# Patient Record
Sex: Male | Born: 1945 | Race: Black or African American | Hispanic: No | Marital: Married | Smoking: Current every day smoker
Health system: Southern US, Community
[De-identification: ages and names within clinical notes are randomized; demographics above are authoritative.]

## PROBLEM LIST (undated history)

## (undated) DIAGNOSIS — R011 Cardiac murmur, unspecified: Secondary | ICD-10-CM

## (undated) DIAGNOSIS — G709 Myoneural disorder, unspecified: Secondary | ICD-10-CM

## (undated) DIAGNOSIS — M1990 Unspecified osteoarthritis, unspecified site: Secondary | ICD-10-CM

## (undated) DIAGNOSIS — Z9289 Personal history of other medical treatment: Secondary | ICD-10-CM

## (undated) DIAGNOSIS — I10 Essential (primary) hypertension: Secondary | ICD-10-CM

## (undated) DIAGNOSIS — E7800 Pure hypercholesterolemia, unspecified: Secondary | ICD-10-CM

## (undated) DIAGNOSIS — F419 Anxiety disorder, unspecified: Secondary | ICD-10-CM

## (undated) HISTORY — PX: BACK SURGERY: SHX140

## (undated) HISTORY — PX: EYE SURGERY: SHX253

## (undated) HISTORY — PX: HERNIA REPAIR: SHX51

## (undated) HISTORY — PX: KNEE ARTHROSCOPY: SUR90

---

## 1999-03-18 ENCOUNTER — Encounter: Payer: Self-pay | Admitting: Urology

## 1999-03-18 ENCOUNTER — Ambulatory Visit (HOSPITAL_COMMUNITY): Admission: RE | Admit: 1999-03-18 | Discharge: 1999-03-18 | Payer: Self-pay | Admitting: Urology

## 1999-05-27 ENCOUNTER — Ambulatory Visit (HOSPITAL_BASED_OUTPATIENT_CLINIC_OR_DEPARTMENT_OTHER): Admission: RE | Admit: 1999-05-27 | Discharge: 1999-05-27 | Payer: Self-pay | Admitting: Orthopedic Surgery

## 1999-10-25 ENCOUNTER — Encounter: Payer: Self-pay | Admitting: Emergency Medicine

## 1999-10-25 ENCOUNTER — Ambulatory Visit (HOSPITAL_COMMUNITY): Admission: RE | Admit: 1999-10-25 | Discharge: 1999-10-25 | Payer: Self-pay | Admitting: Emergency Medicine

## 1999-10-28 ENCOUNTER — Observation Stay (HOSPITAL_COMMUNITY): Admission: RE | Admit: 1999-10-28 | Discharge: 1999-10-29 | Payer: Self-pay | Admitting: Neurosurgery

## 2002-09-27 ENCOUNTER — Encounter: Payer: Self-pay | Admitting: Infectious Diseases

## 2002-09-27 ENCOUNTER — Ambulatory Visit (HOSPITAL_COMMUNITY): Admission: RE | Admit: 2002-09-27 | Discharge: 2002-09-27 | Payer: Self-pay

## 2003-03-06 ENCOUNTER — Observation Stay (HOSPITAL_COMMUNITY): Admission: RE | Admit: 2003-03-06 | Discharge: 2003-03-07 | Payer: Self-pay | Admitting: Neurosurgery

## 2003-11-17 ENCOUNTER — Ambulatory Visit (HOSPITAL_COMMUNITY): Admission: RE | Admit: 2003-11-17 | Discharge: 2003-11-17 | Payer: Self-pay | Admitting: Urology

## 2006-06-03 ENCOUNTER — Ambulatory Visit (HOSPITAL_COMMUNITY): Admission: RE | Admit: 2006-06-03 | Discharge: 2006-06-03 | Payer: Self-pay | Admitting: Orthopaedic Surgery

## 2007-07-21 ENCOUNTER — Encounter: Admission: RE | Admit: 2007-07-21 | Discharge: 2007-07-21 | Payer: Self-pay | Admitting: Neurosurgery

## 2011-02-04 NOTE — Discharge Summary (Signed)
[place]. [facility]  Patient:    William Meyer, William Meyer                         MRN: [id number] Adm. Date:  [id number] Disc. Date: [id number] Attending:  Tressie Stalker D                           Discharge Summary  HISTORY OF PRESENT ILLNESS:  For full details of this admission, please refer to typed history and physical.  The patient is a 65 year old black male who began having severe left leg pain approximately one month ago.  He was treated with medications.  Unfortunately, his did not improve.  He was worked up with a lumbar MRI and it demonstrated a large extraforaminal, i.e. far lateral disc herniation at L4/5 on the left.  The patients signs, symptoms, and physical exam were consistent with a left L4 radiculopathy.  He therefore weighed the risks, benefits, and alternatives to surgery and decided to proceed with a microdiskectomy.  The past medical history, past surgical history, medications prior to admission, drug allergies, family medical history, social history, admission physical exam, imaging studies, assessment, and plan, etc., please refer to typed and history nd physical.  HOSPITAL COURSE:  I admitted the patient to [facility] on October 28, 1999 with the diagnosis of a far lateral hernia disc L4/5 on the left and L4 radiculopathy.  On the day of admission, I performed a far lateral L4/5 microdiskectomy using microdissection surgery and went well without complications.  For full details f this operation, please refer to typed operative note.  The patients postoperative course is essentially unremarkable.  He remained afebrile.  His vital signs are stable.  By postoperative day #1, he was eating ell and ambulating well.  His wound was healing well without signs of infection and his leg pain was gone.  He was requesting discharge home.  I therefore discharged home on October 29, 1999.  DISCHARGE INSTRUCTIONS:  The  patient was given written discharge instructions. He was instructed to follow up with me in three weeks.  DISCHARGE MEDICATIONS: 1. Percocet 5 mg #50 1-2 p.o. q.4h. p.r.n. pain. 2. Valium 5 mg #50 1-2 p.o. q.6h. p.r.n. muscle spasms.  FINAL DIAGNOSES: 1. Far lateral lumbar vertebrae-4/5 herniated nucleus pulposus on the left. 2. Left lumbar vertebrae-4 radiculopathy. 3. Degenerative disc disease. 4. Lumbar spondylosis.  PROCEDURE:  Left far lateral L4/5 microdiskectomy using microdissection. DD:  10/29/99 TD:  10/30/99 Job: [id number]

## 2011-02-04 NOTE — H&P (Signed)
[place]. [facility]  Patient:    William Meyer, William Meyer                         MRN: [id number] Adm. Date:  [id number] Attending:  Tressie Stalker D                         History and Physical  CHIEF COMPLAINT:  Left leg pain.  HISTORY OF PRESENT ILLNESS:  The patient is a 65 year old black male who had some intermittent trouble with his back in the past.  He has been treated medically nd with steroid injections, with good results in 1997, and 1998.  The patient made a good recovery until about three weeks ago when he was at work.  He bent over to  pick up some material and felt a "catch" in his back, and had immediate back pain with pain radiating down his left leg.  He says it is different than his prior ack pain.  This was reported at work.  He initially saw Dr. Perrin Maltese and Dr. Lesle Chris. The patient was treated with medications and did improve.  They kindly sent him for my consultation.  The patient complains of back pain which has improved significantly, but his left leg is hurting more.  The pain worsens with any activities, and it limits his activities of daily living.  It seems to be worse in the morning.  He describes the pain as radiating down the lateral aspect of his left leg, and at times numbness and paresthesias in his left big toe.  He has been at work for approximately four weeks.  His leg hurts more than his back.  He has no trouble with the right leg.  I sent the patient for a lumbar MRI, which demonstrated a large extraforaminal isk herniation at L4-5 on the left.  I therefore discussed surgery with him, after e failed medical management.  He decided to proceed with the operation.  PAST MEDICAL HISTORY:  Positive for cervical degenerative disk disease, left knee osteoarthritis, carpal tunnel syndrome, ulnar neuropathy.  PAST SURGICAL HISTORY:  A left knee surgery in April 1998.  A three-level anterior cervical diskectomy in  1990.  A right carpal tunnel and a right ulnar nerve release.  CURRENT MEDICATIONS PRIOR TO ADMISSION:  Neurontin 300 mg t.i.d., hydrocodone p.r.n., Vioxx 50 mg p.o. q.d.  ALLERGIES:  No known drug allergies.  FAMILY HISTORY:  The patients mother died at age 31, secondary to stomach cancer. The patients father died at age 57, of unknown causes.  SOCIAL HISTORY:  The patient is married and has three children.  He lives in [place].  He is employed at [employer].  He smokes one pack per day of cigarettes x approximately 30 years.  I advised him to quit.  He denies ethanol or drug use.  REVIEW OF SYSTEMS:  Negative except as above.  PHYSICAL EXAMINATION:  GENERAL:  A pleasant, healthy-appearing 65 year old black male, who walks with n antalgic gait, complaining of left leg pain.  Height 5 feet 9 inches, weight 171 pounds.  HEENT:  Normocephalic, atraumatic.  Pupils equal, round, reactive to light. Extraocular muscles intact.  Sclerae white.  Conjunctivae pink.  Oropharynx benign. Uvula midline.  NECK:  Supple, no masses, meningismus, deformities, tracheal deviation, jugular  venous distention, or carotid bruits.  He has a normal cervical range of motion. Spurlings test is negative.  Lhermittes  sign was not present.  THORAX:  Symmetric.  LUNGS:  Clear to auscultation.  HEART:  A regular rate and rhythm.  ABDOMEN:  Soft, nontender.  No guarding or rebound.  EXTREMITIES:  No obvious deformities.  He has a well-healed surgical scar without signs of infection.  BACK:  No point tenderness or deformities.  He has some tenderness to palpation in the left buttocks region.  Straight leg raising is positive on the left and negative on the right.  Fabere testing is negative bilaterally.  NEUROLOGIC:  The patient is alert and oriented x 3.  Cranial nerves II-XII are grossly intact bilaterally.  Vision and hearing grossly normal bilaterally. Motor strength is  5/5 in the bilateral deltoid, biceps, triceps, hand grip, wrist extensor, and interosseous psoas, quadriceps, gastrocnemius, and extensor hallucis longus.  Cerebellar examination is intact to rapid alternating movements of the  upper extremities bilaterally.  Deep tendon reflexes are 1/4 in the bilateral biceps, triceps, brachial radialis, gastrocnemius, and right quadriceps, trace n the left quadriceps.  He has bilateral flexor plantar reflexes.  No ankle clonus. Sensory examination:  Demonstrates decreased light touch and pinprick sensation in the left L4 or L5 distribution, otherwise normal.  Imaging studies:  The patient had a lumbar MRI performed at [facility] on October 25, 1999, which demonstrates he has some mild degenerative isk disease at L3-4, but no neural compression.  At L4-5 he has a large left-sided extraforaminal i.e. far lateral disk herniation with pressure on the left L4 nerve root.  L5-S1 has some central bulging in the disk, but no significant neural compression.  ASSESSMENT:  L4-5 far lateral herniated nucleus pulposus, degenerative disk disease, spinal stenosis, and spondylosis.  I have discussed the diagnoses with the patient and with his wife, and have reviewed the MRI scan, and pointed out the abnormalities.  His signs and symptoms and physical examination are consistent with a left L4 radiculopathy caused by his extraforaminal disk herniation at L4-5 on the left.  I have discussed the various treatment options with him, including continued medical management and surgery. I have described the procedure of a far lateral microdiskectomy.  I have shown him surgical models and have discussed the risks of surgery.  The patient weighed the risks and benefits and alternatives to surgery, and has decided to proceed with the operation. DD:  10/28/99 TD:  10/28/99 Job: [id number]

## 2011-02-04 NOTE — Op Note (Signed)
William Meyer, William Meyer                            ACCOUNT NO.:  [id number]   MEDICAL RECORD NO.:  [id number]                   PATIENT TYPE:  INP   LOCATION:  3012                                 FACILITY:  [facility]   PHYSICIAN:  Cristi Loron, M.D.            DATE OF BIRTH:  [birth year]   DATE OF PROCEDURE:  03/06/2003  DATE OF DISCHARGE:  03/07/2003                                 OPERATIVE REPORT   BRIEF HISTORY:  The patient is a 65 year old black male whom I performed a  left L4-L5 far lateral microdiskectomy a couple of years ago. He did well  but unfortunately a couple of months ago developed pain in his right leg. He  failed medical management and was worked up with a lumbar MRI that  demonstrated a far lateral herniated disk at L3-L4 on the right. The signs  and symptoms of the exam were consistent with a right L3 radiculopathy. I  discussed the various treatment options with him and his wife. They weighted  the risks, benefits, and alternatives of surgery and decided to proceed with  a microdiskectomy.   PREOPERATIVE DIAGNOSES:  Far lateral right L3-L4 herniated nucleus pulposus,  spinal stenosis, lumbar radiculopathy, lumbago.   POSTOPERATIVE DIAGNOSES:  Far lateral right L3-L4 herniated nucleus  pulposus, spinal stenosis, lumbar radiculopathy, lumbago.   PROCEDURE:  Far lateral right L3-L4 microdiskectomy using microdissection.   SURGEON:  Cristi Loron, M.D.   ASSISTANT:  Stefani Dama, M.D.   ANESTHESIA:  General endotracheal.   ESTIMATED BLOOD LOSS:  Less than 100 mL.   SPECIMENS:  None.   DRAINS:  None.   COMPLICATIONS:  None.   DESCRIPTION OF PROCEDURE:  The patient was brought to the operating room by  the anesthesia team. General endotracheal anesthesia was induced. The  patient was then turned to the prone position on the Wilson frame. His  lumbosacral region was then prepared with Betadine and scrubbed with  Betadine solution. Sterile drapes  were applied. I then injected that area to  be incised with Marcaine with epinephrine solution. I used the scalpel to  make a linear midline incision over the L3-4 interspace. I used  electrocautery to dissect down to the thoracolumbar fascia dividing the  fascia just to the right of midline performing a right subperiosteal  dissection. I stripped the paraspinous musculature from the right spinous  process alignment of L3 and L4. I obtained intraoperative radiograph to  confirm location and then used the electrocautery to detach the fascia from  the lateral aspect of the right L3-L4 facet, inserted the McCall retractor  for exposure. We then used the high speed drill to drill out the lateral  aspect of the right L3-L4 facet joint. We then used the Kerrison punch to  remove a little bit more of the lateral aspect of the facet and exposed the  intertransverse ligament. We brought the operating  microscope onto the field  and under magnification completed the microdissection/decompression. We  dissected through the intertransverse ligament and muscle and identified the  exiting L3 nerve root and then just caudal to this was a large herniated  disk which was compressing the exiting L3 nerve root from the caudal and  ventral direction. We used microdissection to carry the soft tissue over the  disk herniation and then incised the herniation with a 15 blade scalpel and  performed a partial diskectomy using the pituitary forceps and the curettes.  We got several large fragments and this greatly decompressed the L3 nerve  root. We then used the osteophyte probe to remove some redundant ligament  from the interspace. At this point, we had good decompression of the nerve  root. We confirmed this by palpating along the ventral surface of the nerve  root all the way from the anterior spinal portion all the way out and  through soft tissue. We used electrocautery to provide stringent hemostasis.  We then  copiously irrigated the wound out with bacitracin solution and  removed the solution and then removed the McCall retractor and then  reapproximated the patient's thoracolumbar fascia with interrupted lumbar  Vicryl suture, subcutaneous tissue with interrupted 2-0 Vicryl suture and  the skin with Steri-Strips and Benzoin. The wound was then coated with  Bacitracin ointment, sterile dressing was applied, drapes were removed. The  patient was subsequently returned to the supine position and was extubated  by the anesthesia team and transported to the post anesthesia care unit in  stable condition. All sponge, needle and instrument counts were correct at  the end of this case.                                               Cristi Loron, M.D.    JDJ/MEDQ  D:  03/06/2003  T:  03/10/2003  Job:  [id number]

## 2011-02-04 NOTE — Op Note (Signed)
. [facility]  Patient:    William Meyer, William Meyer                         MRN: [id number] Proc. Date: 10/28/99 Adm. Date:  [id number] Disc. Date: [id number] Attending:  Tressie Stalker D                           Operative Report  PREOPERATIVE DIAGNOSIS:  L4-5 degenerative disk disease, herniated nucleus pulposus, and spondylosis.  POSTOPERATIVE DIAGNOSIS:  L4-5 degenerative disk disease, herniated nucleus pulposus, and spondylosis.  PROCEDURE:  Left far lateral (i.e. extraforaminal) microdiskectomy using microdissection of L4-5.  SURGEON:  Cristi Loron, M.D.  ASSISTANT:  Stefani Dama, M.D.  ANESTHESIA:  General endotracheal.  ESTIMATED BLOOD LOSS:  Less than 100 cc.  SPECIMENS:  None.  DRAINS:  None.  COMPLICATIONS:  None.  BRIEF HISTORY:  The patient is a 65 year old black male who has had problems with lumbago in the past, and has been treated with medications and steroid injections. Unfortunately, about a month ago, he bent over at work and hurt his back.  He began having severe left leg pain.  He had failed medical management and therefore was worked up with a lumbar MRI that demonstrated extraforaminal disk herniation at  L4-5 on the left.  His signs and symptoms on physical exam were consistent with a left L4 radiculopathy.  The patient therefore weighed the risks, benefits and alternatives of surgery, and it was decided to proceed with a far lateral L4-5 microdiskectomy.  DESCRIPTION OF PROCEDURE:  The patient was brought to the operating room by the  anesthesia team.  General endotracheal anesthesia was induced.  The patient was  then turned to the prone position on the Wilson frame.  The lumbosacral region as then shaved and prepared with Betadine scrub with Betadine solution, and sterile drapes were applied.  I then injected the area to be incised with Marcaine with  epinephrine solution.  I then used scalpel to make  a midline incision over the 4-5 interspace.  I used electrocautery to dissect down to the thoracolumbar fascia, and divided it just to the left of the midline, and performed a left-sided subperiosteal dissection, stripping the paraspinous musculature from the left spinous process and lamina of L4 and L5.  I then obtained an intraoperative radiograph that confirmed that I was at L4-5.  I then used electrocautery to attach the fascia from the lateral aspect of the L4-5 facet joint.  I then inserted the [facility] retractor for exposure.  I then brought the operative microscope into the field and under instant magnification and illumination, I completed the microdissection/decompression. I used a Midas Rex high speed drill to drill away the lateral aspect of the left 4-5 facet joint.  I drilled down until I encountered the anterior transverse ligament. I then divided this ligament with the 15 blade scalpel and the Kerrison punches.  I then carefully dissected down into the soft tissues using microdissection.  I identified the right L4 nerve, just ventral and inferior to it with a large herniated disk, i.e. a far lateral herniated disk from L4-5.  I incised this herniated disk, removed it in several fragments.  The disk had migrated in a cephalad direction, significantly compressing the left L4 nerve root.  I then encountered a rather large hole in the annulus.  I then widened this hole with he 15 blade  scalpel.  I performed a partial anterior vertebral diskectomy at L4-5 using the pituitary forceps, Epstein and Scoville curets.  After I was satisfied with the diskectomy, I then palpated the ventral surface f the L4 nerve root with the coronary dilator, and noted it was well-decompressed all the way throughout the neuroforamen.  I then achieved strenuous hemostasis using bipolar electrocautery and then copiously irrigated the wound out with Bacitracin solution.  I then deposited  Depo-Medrol and fentanyl solution into the epidural  space, and removed the McCullough retractor, and then reapproximated the patients thoracolumbar fascia with interrupted #1 Vicryl, the subcutaneous tissue with interrupted 2-0 Vicryl, the skin with Steri-Strips and Benzoin.  The wound was hen coated with Bacitracin ointment and a sterile dressing applied.  The drapes were removed and the patient was subsequently extubated by the anesthesia team, and transported to the postanesthesia care unit in stable condition.  All sponge, instrument, and needle counts were correct at the end of the case. DD:  10/28/99 TD:  10/29/99 Job: [id number]

## 2014-04-17 ENCOUNTER — Other Ambulatory Visit (HOSPITAL_COMMUNITY): Payer: Self-pay | Admitting: Neurosurgery

## 2014-04-17 DIAGNOSIS — M25569 Pain in unspecified knee: Secondary | ICD-10-CM

## 2014-04-23 ENCOUNTER — Ambulatory Visit (HOSPITAL_COMMUNITY): Payer: Medicare Other | Attending: Neurosurgery

## 2014-07-07 ENCOUNTER — Other Ambulatory Visit (HOSPITAL_COMMUNITY): Payer: Self-pay | Admitting: Neurosurgery

## 2014-07-18 ENCOUNTER — Encounter (HOSPITAL_COMMUNITY): Payer: Self-pay | Admitting: Pharmacy Technician

## 2014-07-23 NOTE — Pre-Procedure Instructions (Signed)
August SaucerHoby R Iannelli  07/23/2014   Your procedure is scheduled on: Wednesday, July 30, 2014  [facility] 6:30 AM.  Call this number if you have problems the morning of surgery: [phone]   Remember:   Do not eat food or drink liquids after midnight Tuesday, July 29, 2014   Take these medicines the morning of surgery with A SIP OF WATER: cetirizine (ZYRTEC), if needed:   HYDROcodone  (NORCO/VICODIN) for pain Stop taking Aspirin, vitamins, and herbal medications  (Omega-3 Fatty Acids (FISH OIL).  Do not take any NSAIDs ie: Ibuprofen, Advil, Naproxen or any medication containing Aspirin; stop 5 days prior to procedure ( Friday. July 25, 2014 ).  Do not wear jewelry, make-up or nail polish.  Do not wear lotions, powders, or perfumes. You may not wear deodorant.  Do not shave 48 hours prior to surgery. Men may shave face and neck.  Do not bring valuables to the hospital.  [facility] is not responsible for any belongings or valuables.               Contacts, dentures or bridgework may not be worn into surgery.  Leave suitcase in the car. After surgery it may be brought to your room.  For patients admitted to the hospital, discharge time is determined by your treatment team.               Patients discharged the day of surgery will not be allowed to drive home.  Name and phone number of your driver:  Special Instructions:  Special Instructions:Special Instructions: [facility] - Preparing for Surgery  Before surgery, you can play an important role.  Because skin is not sterile, your skin needs to be as free of germs as possible.  You can reduce the number of germs on you skin by washing with CHG (chlorahexidine gluconate) soap before surgery.  CHG is an antiseptic cleaner which kills germs and bonds with the skin to continue killing germs even after washing.  Please DO NOT use if you have an allergy to CHG or antibacterial soaps.  If your skin becomes  reddened/irritated stop using the CHG and inform your nurse when you arrive at Short Stay.  Do not shave (including legs and underarms) for at least 48 hours prior to the first CHG shower.  You may shave your face.  Please follow these instructions carefully:   1.  Shower with CHG Soap the night before surgery and the morning of Surgery.  2.  If you choose to wash your hair, wash your hair first as usual with your normal shampoo.  3.  After you shampoo, rinse your hair and body thoroughly to remove the Shampoo.  4.  Use CHG as you would any other liquid soap.  You can apply chg directly  to the skin and wash gently with scrungie or a clean washcloth.  5.  Apply the CHG Soap to your body ONLY FROM THE NECK DOWN.  Do not use on open wounds or open sores.  Avoid contact with your eyes, ears, mouth and genitals (private parts).  Wash genitals (private parts) with your normal soap.  6.  Wash thoroughly, paying special attention to the area where your surgery will be performed.  7.  Thoroughly rinse your body with warm water from the neck down.  8.  DO NOT shower/wash with your normal soap after using and rinsing off the CHG Soap.  9.  Pat yourself dry  with a clean towel.            10.  Wear clean pajamas.            11.  Place clean sheets on your bed the night of your first shower and do not sleep with pets.  Day of Surgery  Do not apply any lotions/deodorants the morning of surgery.  Please wear clean clothes to the hospital/surgery center.   Please read over the following fact sheets that you were given: Pain Booklet, Coughing and Deep Breathing, MRSA Information and Surgical Site Infection Prevention

## 2014-07-24 ENCOUNTER — Encounter (HOSPITAL_COMMUNITY): Payer: Self-pay

## 2014-07-24 ENCOUNTER — Encounter (HOSPITAL_COMMUNITY)
Admission: RE | Admit: 2014-07-24 | Discharge: 2014-07-24 | Disposition: A | Discharge status: Home / Self Care | Payer: Medicare Other | Source: Ambulatory Visit | Attending: Neurosurgery | Admitting: Neurosurgery

## 2014-07-24 DIAGNOSIS — Z01812 Encounter for preprocedural laboratory examination: Secondary | ICD-10-CM | POA: Diagnosis present

## 2014-07-24 DIAGNOSIS — M4806 Spinal stenosis, lumbar region: Secondary | ICD-10-CM | POA: Insufficient documentation

## 2014-07-24 HISTORY — DX: Myoneural disorder, unspecified: G70.9

## 2014-07-24 HISTORY — DX: Anxiety disorder, unspecified: F41.9

## 2014-07-24 HISTORY — DX: Unspecified osteoarthritis, unspecified site: M19.90

## 2014-07-24 HISTORY — DX: Personal history of other medical treatment: Z92.89

## 2014-07-24 HISTORY — DX: Cardiac murmur, unspecified: R01.1

## 2014-07-24 LAB — BASIC METABOLIC PANEL
ANION GAP: 10 (ref 5–15)
BUN: 13 mg/dL (ref 6–23)
CALCIUM: 9.5 mg/dL (ref 8.4–10.5)
CO2: 27 meq/L (ref 19–32)
CREATININE: 0.91 mg/dL (ref 0.50–1.35)
Chloride: 103 mEq/L (ref 96–112)
GFR calc Af Amer: >90 mL/min (ref >90)
GFR, EST NON AFRICAN AMERICAN: 86 mL/min — AB (ref >90)
GLUCOSE: 96 mg/dL (ref 70–99)
Potassium: 4.8 mEq/L (ref 3.7–5.3)
Sodium: 140 mEq/L (ref 137–147)

## 2014-07-24 LAB — CBC
HCT: 42.2 % (ref 39.0–52.0)
Hemoglobin: 13.8 g/dL (ref 13.0–17.0)
MCH: 22.9 pg — ABNORMAL LOW (ref 26.0–34.0)
MCHC: 32.7 g/dL (ref 30.0–36.0)
MCV: 70.1 fL — ABNORMAL LOW (ref 78.0–100.0)
Platelets: 239 10*3/uL (ref 150–400)
RBC: 6.02 MIL/uL — AB (ref 4.22–5.81)
RDW: 15 % (ref 11.5–15.5)
WBC: 8.3 10*3/uL (ref 4.0–10.5)

## 2014-07-24 LAB — SURGICAL PCR SCREEN
MRSA, PCR: NEGATIVE
STAPHYLOCOCCUS AUREUS: NEGATIVE

## 2014-07-24 NOTE — Progress Notes (Signed)
Pt. [facility]- Dr. Gerlene FeePiva.  Pt. Never seen by cardiology, states several yrs. Ago he was told that he had a heart murmur.  Last EKG, approx. 2 yrs. Ago, at [facility], for cataract surgery

## 2014-07-29 MED ORDER — CEFAZOLIN SODIUM-DEXTROSE 2-3 GM-% IV SOLR
2.0000 g | INTRAVENOUS | Status: AC
  Administered 2014-07-30: 2 g via INTRAVENOUS
  Filled 2014-07-29: qty 50

## 2014-07-30 ENCOUNTER — Inpatient Hospital Stay (HOSPITAL_COMMUNITY): Payer: No Typology Code available for payment source | Admitting: Anesthesiology

## 2014-07-30 ENCOUNTER — Encounter (HOSPITAL_COMMUNITY)
Admission: RE | Disposition: A | Discharge status: Home / Self Care | Payer: Self-pay | Source: Ambulatory Visit | Attending: Neurosurgery

## 2014-07-30 ENCOUNTER — Inpatient Hospital Stay (HOSPITAL_COMMUNITY)
Admission: RE | Admit: 2014-07-30 | Discharge: 2014-07-31 | DRG: 515 | Disposition: A | Discharge status: Home / Self Care | Payer: No Typology Code available for payment source | Source: Ambulatory Visit | Attending: Neurosurgery | Admitting: Neurosurgery

## 2014-07-30 ENCOUNTER — Encounter (HOSPITAL_COMMUNITY): Payer: Self-pay

## 2014-07-30 ENCOUNTER — Inpatient Hospital Stay (HOSPITAL_COMMUNITY): Payer: No Typology Code available for payment source

## 2014-07-30 DIAGNOSIS — M48062 Spinal stenosis, lumbar region with neurogenic claudication: Secondary | ICD-10-CM | POA: Diagnosis present

## 2014-07-30 DIAGNOSIS — M549 Dorsalgia, unspecified: Secondary | ICD-10-CM | POA: Diagnosis present

## 2014-07-30 DIAGNOSIS — M4806 Spinal stenosis, lumbar region: Secondary | ICD-10-CM | POA: Diagnosis present

## 2014-07-30 DIAGNOSIS — F419 Anxiety disorder, unspecified: Secondary | ICD-10-CM | POA: Diagnosis present

## 2014-07-30 DIAGNOSIS — F17210 Nicotine dependence, cigarettes, uncomplicated: Secondary | ICD-10-CM | POA: Diagnosis present

## 2014-07-30 DIAGNOSIS — Z981 Arthrodesis status: Secondary | ICD-10-CM | POA: Diagnosis not present

## 2014-07-30 DIAGNOSIS — G9519 Other vascular myelopathies: Secondary | ICD-10-CM | POA: Diagnosis present

## 2014-07-30 DIAGNOSIS — M48061 Spinal stenosis, lumbar region without neurogenic claudication: Secondary | ICD-10-CM

## 2014-07-30 HISTORY — PX: LUMBAR LAMINECTOMY/DECOMPRESSION MICRODISCECTOMY: SHX5026

## 2014-07-30 SURGERY — LUMBAR LAMINECTOMY/DECOMPRESSION MICRODISCECTOMY 4 LEVEL
Anesthesia: General | Site: Back

## 2014-07-30 MED ORDER — BUPIVACAINE LIPOSOME 1.3 % IJ SUSP
20.0000 mL | INTRAMUSCULAR | Status: DC
Start: 2014-07-30 — End: 2014-07-30
  Filled 2014-07-30: qty 20

## 2014-07-30 MED ORDER — ROCURONIUM BROMIDE 50 MG/5ML IV SOLN
INTRAVENOUS | Status: AC
  Filled 2014-07-30: qty 1

## 2014-07-30 MED ORDER — LIDOCAINE HCL (CARDIAC) 20 MG/ML IV SOLN
INTRAVENOUS | Status: DC | PRN
  Administered 2014-07-30: 80 mg via INTRAVENOUS

## 2014-07-30 MED ORDER — LIDOCAINE HCL (CARDIAC) 20 MG/ML IV SOLN
INTRAVENOUS | Status: AC
  Filled 2014-07-30: qty 5

## 2014-07-30 MED ORDER — ACETAMINOPHEN 650 MG RE SUPP: 650.0000 mg | RECTAL | Status: DC | PRN

## 2014-07-30 MED ORDER — HYDROMORPHONE HCL 1 MG/ML IJ SOLN
INTRAMUSCULAR | Status: AC
  Filled 2014-07-30: qty 1

## 2014-07-30 MED ORDER — ALUM & MAG HYDROXIDE-SIMETH 200-200-20 MG/5ML PO SUSP: 30.0000 mL | Freq: Four times a day (QID) | ORAL | Status: DC | PRN

## 2014-07-30 MED ORDER — OXYCODONE HCL 5 MG PO TABS: 5.0000 mg | ORAL_TABLET | Freq: Once | ORAL | Status: DC | PRN

## 2014-07-30 MED ORDER — OXYCODONE-ACETAMINOPHEN 5-325 MG PO TABS
1.0000 | ORAL_TABLET | ORAL | Status: DC | PRN
  Administered 2014-07-30 (×2): 2 via ORAL
  Filled 2014-07-30 (×3): qty 2

## 2014-07-30 MED ORDER — SUCCINYLCHOLINE CHLORIDE 20 MG/ML IJ SOLN
INTRAMUSCULAR | Status: AC
  Filled 2014-07-30: qty 1

## 2014-07-30 MED ORDER — THROMBIN 20000 UNITS EX SOLR
CUTANEOUS | Status: DC | PRN
  Administered 2014-07-30: 09:00:00 via TOPICAL

## 2014-07-30 MED ORDER — ONDANSETRON HCL 4 MG/2ML IJ SOLN: 4.0000 mg | INTRAMUSCULAR | Status: DC | PRN

## 2014-07-30 MED ORDER — GLYCOPYRROLATE 0.2 MG/ML IJ SOLN
INTRAMUSCULAR | Status: DC | PRN
  Administered 2014-07-30: 0.4 mg via INTRAVENOUS

## 2014-07-30 MED ORDER — MIDAZOLAM HCL 2 MG/2ML IJ SOLN
INTRAMUSCULAR | Status: AC
  Filled 2014-07-30: qty 2

## 2014-07-30 MED ORDER — PHENYLEPHRINE HCL 10 MG/ML IJ SOLN
INTRAMUSCULAR | Status: DC | PRN
  Administered 2014-07-30 (×3): 80 ug via INTRAVENOUS

## 2014-07-30 MED ORDER — PROPOFOL 10 MG/ML IV BOLUS
INTRAVENOUS | Status: DC | PRN
  Administered 2014-07-30: 180 mg via INTRAVENOUS

## 2014-07-30 MED ORDER — PHENYLEPHRINE 40 MCG/ML (10ML) SYRINGE FOR IV PUSH (FOR BLOOD PRESSURE SUPPORT)
PREFILLED_SYRINGE | INTRAVENOUS | Status: AC
  Filled 2014-07-30: qty 10

## 2014-07-30 MED ORDER — CEFAZOLIN SODIUM-DEXTROSE 2-3 GM-% IV SOLR
2.0000 g | Freq: Three times a day (TID) | INTRAVENOUS | Status: AC
  Administered 2014-07-30 (×2): 2 g via INTRAVENOUS
  Filled 2014-07-30 (×3): qty 50

## 2014-07-30 MED ORDER — SODIUM CHLORIDE 0.9 % IR SOLN
Status: DC | PRN
  Administered 2014-07-30: 09:00:00

## 2014-07-30 MED ORDER — ROCURONIUM BROMIDE 100 MG/10ML IV SOLN
INTRAVENOUS | Status: DC | PRN
  Administered 2014-07-30: 50 mg via INTRAVENOUS
  Administered 2014-07-30: 30 mg via INTRAVENOUS

## 2014-07-30 MED ORDER — MENTHOL 3 MG MT LOZG: 1.0000 | LOZENGE | OROMUCOSAL | Status: DC | PRN

## 2014-07-30 MED ORDER — SODIUM CHLORIDE 0.9 % IV SOLN
10.0000 mg | INTRAVENOUS | Status: DC | PRN
  Administered 2014-07-30: 10 ug/min via INTRAVENOUS

## 2014-07-30 MED ORDER — ACETAMINOPHEN 325 MG PO TABS: 650.0000 mg | ORAL_TABLET | ORAL | Status: DC | PRN

## 2014-07-30 MED ORDER — GLYCOPYRROLATE 0.2 MG/ML IJ SOLN
INTRAMUSCULAR | Status: AC
  Filled 2014-07-30: qty 2

## 2014-07-30 MED ORDER — 0.9 % SODIUM CHLORIDE (POUR BTL) OPTIME
TOPICAL | Status: DC | PRN
  Administered 2014-07-30: 1000 mL

## 2014-07-30 MED ORDER — ONDANSETRON HCL 4 MG/2ML IJ SOLN
INTRAMUSCULAR | Status: AC
  Filled 2014-07-30: qty 2

## 2014-07-30 MED ORDER — MORPHINE SULFATE 2 MG/ML IJ SOLN
1.0000 mg | INTRAMUSCULAR | Status: DC | PRN
  Administered 2014-07-31: 4 mg via INTRAVENOUS
  Filled 2014-07-30: qty 2

## 2014-07-30 MED ORDER — FENTANYL CITRATE 0.05 MG/ML IJ SOLN
INTRAMUSCULAR | Status: DC | PRN
  Administered 2014-07-30: 50 ug via INTRAVENOUS
  Administered 2014-07-30: 100 ug via INTRAVENOUS
  Administered 2014-07-30 (×2): 50 ug via INTRAVENOUS

## 2014-07-30 MED ORDER — ONDANSETRON HCL 4 MG/2ML IJ SOLN
INTRAMUSCULAR | Status: DC | PRN
  Administered 2014-07-30: 4 mg via INTRAVENOUS

## 2014-07-30 MED ORDER — OXYCODONE HCL 5 MG/5ML PO SOLN: 5.0000 mg | Freq: Once | ORAL | Status: DC | PRN

## 2014-07-30 MED ORDER — LACTATED RINGERS IV SOLN: INTRAVENOUS | Status: DC

## 2014-07-30 MED ORDER — NEOSTIGMINE METHYLSULFATE 10 MG/10ML IV SOLN
INTRAVENOUS | Status: DC | PRN
  Administered 2014-07-30: 3 mg via INTRAVENOUS

## 2014-07-30 MED ORDER — BACITRACIN ZINC 500 UNIT/GM EX OINT
TOPICAL_OINTMENT | CUTANEOUS | Status: DC | PRN
  Administered 2014-07-30: 1 via TOPICAL

## 2014-07-30 MED ORDER — HYDROMORPHONE HCL 1 MG/ML IJ SOLN
0.2500 mg | INTRAMUSCULAR | Status: DC | PRN
  Administered 2014-07-30 (×4): 0.5 mg via INTRAVENOUS

## 2014-07-30 MED ORDER — ONDANSETRON HCL 4 MG/2ML IJ SOLN: 4.0000 mg | Freq: Four times a day (QID) | INTRAMUSCULAR | Status: DC | PRN

## 2014-07-30 MED ORDER — HYDROCODONE-ACETAMINOPHEN 5-325 MG PO TABS
1.0000 | ORAL_TABLET | ORAL | Status: DC | PRN
  Administered 2014-07-31 (×3): 2 via ORAL
  Filled 2014-07-30 (×3): qty 2

## 2014-07-30 MED ORDER — PHENYLEPHRINE HCL 10 MG/ML IJ SOLN
INTRAMUSCULAR | Status: AC
  Filled 2014-07-30: qty 1

## 2014-07-30 MED ORDER — DOCUSATE SODIUM 100 MG PO CAPS
100.0000 mg | ORAL_CAPSULE | Freq: Two times a day (BID) | ORAL | Status: DC
  Administered 2014-07-30 – 2014-07-31 (×2): 100 mg via ORAL
  Filled 2014-07-30 (×3): qty 1

## 2014-07-30 MED ORDER — LACTATED RINGERS IV SOLN
INTRAVENOUS | Status: DC | PRN
  Administered 2014-07-30 (×2): via INTRAVENOUS

## 2014-07-30 MED ORDER — DIAZEPAM 5 MG PO TABS
5.0000 mg | ORAL_TABLET | Freq: Four times a day (QID) | ORAL | Status: DC | PRN
  Administered 2014-07-30 – 2014-07-31 (×3): 5 mg via ORAL
  Filled 2014-07-30 (×3): qty 1

## 2014-07-30 MED ORDER — PROPOFOL 10 MG/ML IV BOLUS
INTRAVENOUS | Status: AC
  Filled 2014-07-30: qty 20

## 2014-07-30 MED ORDER — SODIUM CHLORIDE 0.9 % IJ SOLN
INTRAMUSCULAR | Status: AC
  Filled 2014-07-30: qty 10

## 2014-07-30 MED ORDER — BUPIVACAINE LIPOSOME 1.3 % IJ SUSP
INTRAMUSCULAR | Status: DC | PRN
  Administered 2014-07-30: 20 mL

## 2014-07-30 MED ORDER — FENTANYL CITRATE 0.05 MG/ML IJ SOLN
INTRAMUSCULAR | Status: AC
  Filled 2014-07-30: qty 5

## 2014-07-30 MED ORDER — DIPHENHYDRAMINE HCL 25 MG PO CAPS
25.0000 mg | ORAL_CAPSULE | Freq: Four times a day (QID) | ORAL | Status: DC | PRN
  Administered 2014-07-30 – 2014-07-31 (×2): 25 mg via ORAL
  Filled 2014-07-30 (×2): qty 1

## 2014-07-30 MED ORDER — MIDAZOLAM HCL 5 MG/5ML IJ SOLN
INTRAMUSCULAR | Status: DC | PRN
  Administered 2014-07-30: 2 mg via INTRAVENOUS

## 2014-07-30 MED ORDER — EPHEDRINE SULFATE 50 MG/ML IJ SOLN
INTRAMUSCULAR | Status: AC
  Filled 2014-07-30: qty 1

## 2014-07-30 MED ORDER — LORATADINE 10 MG PO TABS
10.0000 mg | ORAL_TABLET | Freq: Every day | ORAL | Status: DC
  Administered 2014-07-31: 10 mg via ORAL
  Filled 2014-07-30: qty 1

## 2014-07-30 MED ORDER — PHENOL 1.4 % MT LIQD: 1.0000 | OROMUCOSAL | Status: DC | PRN

## 2014-07-30 SURGICAL SUPPLY — 58 items
APL SKNCLS STERI-STRIP NONHPOA (GAUZE/BANDAGES/DRESSINGS) ×1
BAG DECANTER FOR FLEXI CONT (MISCELLANEOUS) ×3 IMPLANT
BENZOIN TINCTURE PRP APPL 2/3 (GAUZE/BANDAGES/DRESSINGS) ×3 IMPLANT
BLADE CLIPPER SURG (BLADE) IMPLANT
BRUSH SCRUB EZ PLAIN DRY (MISCELLANEOUS) ×3 IMPLANT
BUR MATCHSTICK NEURO 3.0 LAGG (BURR) ×3 IMPLANT
BUR PRECISION FLUTE 6.0 (BURR) ×3 IMPLANT
CANISTER SUCT 3000ML (MISCELLANEOUS) ×3 IMPLANT
CLOSURE WOUND 1/2 X4 (GAUZE/BANDAGES/DRESSINGS) ×1
CONT SPEC 4OZ CLIKSEAL STRL BL (MISCELLANEOUS) ×3 IMPLANT
DRAPE LAPAROTOMY 100X72X124 (DRAPES) ×3 IMPLANT
DRAPE MICROSCOPE LEICA (MISCELLANEOUS) ×3 IMPLANT
DRAPE POUCH INSTRU U-SHP 10X18 (DRAPES) ×3 IMPLANT
DRAPE SURG 17X23 STRL (DRAPES) ×12 IMPLANT
ELECT BLADE 4.0 EZ CLEAN MEGAD (MISCELLANEOUS) ×3
ELECT REM PT RETURN 9FT ADLT (ELECTROSURGICAL) ×3
ELECTRODE BLDE 4.0 EZ CLN MEGD (MISCELLANEOUS) ×1 IMPLANT
ELECTRODE REM PT RTRN 9FT ADLT (ELECTROSURGICAL) ×1 IMPLANT
EVACUATOR 1/8 PVC DRAIN (DRAIN) ×2 IMPLANT
GAUZE SPONGE 4X4 12PLY STRL (GAUZE/BANDAGES/DRESSINGS) ×3 IMPLANT
GAUZE SPONGE 4X4 16PLY XRAY LF (GAUZE/BANDAGES/DRESSINGS) IMPLANT
GLOVE BIO SURGEON STRL SZ8 (GLOVE) ×2 IMPLANT
GLOVE BIO SURGEON STRL SZ8.5 (GLOVE) ×3 IMPLANT
GLOVE BIOGEL PI IND STRL 7.0 (GLOVE) IMPLANT
GLOVE BIOGEL PI INDICATOR 7.0 (GLOVE) ×2
GLOVE EXAM NITRILE LRG STRL (GLOVE) IMPLANT
GLOVE EXAM NITRILE MD LF STRL (GLOVE) IMPLANT
GLOVE EXAM NITRILE XL STR (GLOVE) IMPLANT
GLOVE EXAM NITRILE XS STR PU (GLOVE) IMPLANT
GLOVE OPTIFIT SS 6.5 STRL BRWN (GLOVE) ×6 IMPLANT
GLOVE SS BIOGEL STRL SZ 8 (GLOVE) ×1 IMPLANT
GLOVE SUPERSENSE BIOGEL SZ 8 (GLOVE) ×2
GOWN STRL REUS W/ TWL LRG LVL3 (GOWN DISPOSABLE) IMPLANT
GOWN STRL REUS W/ TWL XL LVL3 (GOWN DISPOSABLE) ×1 IMPLANT
GOWN STRL REUS W/TWL 2XL LVL3 (GOWN DISPOSABLE) IMPLANT
GOWN STRL REUS W/TWL LRG LVL3 (GOWN DISPOSABLE) ×3
GOWN STRL REUS W/TWL XL LVL3 (GOWN DISPOSABLE) ×6
KIT BASIN OR (CUSTOM PROCEDURE TRAY) ×3 IMPLANT
KIT ROOM TURNOVER OR (KITS) ×3 IMPLANT
NDL HYPO 21X1.5 SAFETY (NEEDLE) IMPLANT
NEEDLE HYPO 21X1.5 SAFETY (NEEDLE) ×3 IMPLANT
NEEDLE HYPO 22GX1.5 SAFETY (NEEDLE) ×3 IMPLANT
NS IRRIG 1000ML POUR BTL (IV SOLUTION) ×3 IMPLANT
PACK LAMINECTOMY NEURO (CUSTOM PROCEDURE TRAY) ×3 IMPLANT
PAD ARMBOARD 7.5X6 YLW CONV (MISCELLANEOUS) ×9 IMPLANT
PATTIES SURGICAL .5 X1 (DISPOSABLE) IMPLANT
RUBBERBAND STERILE (MISCELLANEOUS) ×6 IMPLANT
SPONGE SURGIFOAM ABS GEL 100 (HEMOSTASIS) ×3 IMPLANT
STRIP CLOSURE SKIN 1/2X4 (GAUZE/BANDAGES/DRESSINGS) ×2 IMPLANT
SUT VIC AB 1 CT1 18XBRD ANBCTR (SUTURE) ×2 IMPLANT
SUT VIC AB 1 CT1 8-18 (SUTURE) ×6
SUT VIC AB 2-0 CP2 18 (SUTURE) ×6 IMPLANT
SYR 20CC LL (SYRINGE) ×2 IMPLANT
SYR 20ML ECCENTRIC (SYRINGE) ×3 IMPLANT
TAPE STRIPS DRAPE STRL (GAUZE/BANDAGES/DRESSINGS) ×2 IMPLANT
TOWEL OR 17X24 6PK STRL BLUE (TOWEL DISPOSABLE) ×3 IMPLANT
TOWEL OR 17X26 10 PK STRL BLUE (TOWEL DISPOSABLE) ×3 IMPLANT
WATER STERILE IRR 1000ML POUR (IV SOLUTION) ×3 IMPLANT

## 2014-07-30 NOTE — Progress Notes (Signed)
Utilization review completed.  

## 2014-07-30 NOTE — Anesthesia Postprocedure Evaluation (Signed)
Anesthesia Post Note  Patient: William Meyer  Procedure(s) Performed: Procedure(s) (LRB): LUMBAR LAMINECTOMY/DECOMPRESSION MICRODISCECTOMY. LUMBAR TWO/THREE, THREE/FOUR, FOUR/FIVE, FIVE/SACRAL ONE (N/A)  Anesthesia type: General  Patient location: PACU  Post pain: Pain level controlled and Adequate analgesia  Post assessment: Post-op Vital signs reviewed, Patient's Cardiovascular Status Stable, Respiratory Function Stable, Patent Airway and Pain level controlled  Last Vitals:  Filed Vitals:   07/30/14 1318  BP: 169/72  Pulse: 79  Temp: 36.4 C  Resp: 16    Post vital signs: Reviewed and stable  Level of consciousness: awake, alert  and oriented  Complications: No apparent anesthesia complications

## 2014-07-30 NOTE — Op Note (Signed)
Brief history:the patient is a 68 year old black male who has complained of back, buttock and leg pain consistent with neurogenic claudication. He has had previous lumbar surgery. He was worked up with a lumbar MRI which demonstrated multilevel stenosis.I discussed the various treatment options with the patient including surgery. He has weighed the risks, benefits, and alternative surgery and decided proceed with a decompressive laminectomy.  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 Delma OfficerJeff Vivianna Piccini  Asst.:none  Anesthesia: Gen. endotracheal  Estimated blood loss:200 mL  Drains: one medium Hemovac drain in the epidural space  Complications: None  Description of procedure: The patient was brought to the operating room by the anesthesia team. General endotracheal anesthesia was induced. The patient was turned to the prone position on the Wilson frame. The patient's lumbosacral region was then prepared with Betadine scrub and Betadine solution. Sterile drapes were applied.  I then injected the area to be incised with Marcaine with epinephrine solution. I then used a scalpel to make a linear midline incision over theL2-3, L3-4, L4-5 and L5-S1intervertebral disc space. I then used electrocautery to perform a bilateral subperiosteal dissection exposing the spinous process and lamina ofL2 to the upper sacrum. We obtained intraoperative radiograph to confirm our location. I then [facility] the [facility] for exposure.I incised the interspinous ligament at L2-3, L3-4, L4-5 and L5-S1 with the scalpel. I used a Leksell rongeur to remove the spinous process of L3, L4, and L5. I removed the caudal aspect of the L2 spinous process.  We then brought the  operative microscope into the field. Under its magnification and illumination we completed the microdissection. I used a high-speed drill to perform a laminotomy atL2, L3, L4, L5 and S1 bilaterally. I then used a Kerrison punches tocomplete the laminectomy at L3, L4 and L5 and to widen the laminotomies at L2 and removed the ligamentum flavum atL2-3, L3-4, L4-5 and L5-S1. We then used microdissection to free up the thecal sac and the bilateral L3, L4, L5 and S1 nerve root from the epidural tissue. I then used a Kerrison punch to perform a foraminotomy at about the bilateral L3, L4, L5 and S1 nerve root. We inspected the intervertebral discs at L2-3, L3-4, L4-5 and L5-S1 bilaterally. There were no significant herniations.  I then palpated along the ventral surface of the thecal sac and along exit route of theBilateral L3, L4, L5 and S1 nerve root and noted that the neural structures were well decompressed. This completed the decompression.  We then obtained hemostasis using bipolar electrocautery. We irrigated the wound out with bacitracin solution. We then removed the retractor.I placed a medium Hemovac drain in the epidural space and tunneled it out through a separate stab wound. We then reapproximated the patient's thoracolumbar fascia with interrupted #1 Vicryl suture. We then reapproximated the patient's subcutaneous tissue with interrupted 2-0 Vicryl suture. We then reapproximated patient's skin with Steri-Strips and benzoin. The was then coated with bacitracin ointment. The drapes were removed. The patient was subsequently returned to the supine position where they were extubated by the anesthesia team. The patient was then transported to the postanesthesia care unit in stable condition. All sponge instrument and needle counts were reportedly correct at the end of this case.

## 2014-07-30 NOTE — Anesthesia Procedure Notes (Signed)
Procedure Name: Intubation Date/Time: 07/30/2014 8:34 AM Performed by: Quentin OreWALKER, Ryleigh Esqueda E Pre-anesthesia Checklist: Patient identified, Emergency Drugs available, Suction available, Patient being monitored and Timeout performed Patient Re-evaluated:Patient Re-evaluated prior to inductionOxygen Delivery Method: Circle system utilized Preoxygenation: Pre-oxygenation with 100% oxygen Intubation Type: IV induction Ventilation: Mask ventilation without difficulty Laryngoscope Size: Mac and 3 Grade View: Grade I Tube type: Oral Tube size: 7.5 mm Number of attempts: 1 Airway Equipment and Method: Stylet Placement Confirmation: ETT inserted through vocal cords under direct vision,  positive ETCO2 and breath sounds checked- equal and bilateral Secured at: 23 cm Tube secured with: Tape Dental Injury: Teeth and Oropharynx as per pre-operative assessment

## 2014-07-30 NOTE — Plan of Care (Signed)
Problem: Consults Goal: Spinal Surgery Patient Education See Patient Education Module for education specifics. Outcome: Completed/Met Date Met:  07/30/14

## 2014-07-30 NOTE — Progress Notes (Signed)
Subjective:  the patient is somnolent but easily arousable. He is in no apparent distress.  Objective: Vital signs in last 24 hours: Temp:  [97.6 F (36.4 C)-98.3 F (36.8 C)] 97.6 F (36.4 C) (11/11 1149) Pulse Rate:  [72] 72 (11/11 0700) Resp:  [18] 18 (11/11 0700) BP: (164)/(93) 164/93 mmHg (11/11 0700) SpO2:  [100 %] 100 % (11/11 0700) Weight:  [81.194 kg (179 lb)] 81.194 kg (179 lb) (11/11 0700)  Intake/Output from previous day:   Intake/Output this shift: Total I/O In: 1000 [I.V.:1000] Out: 200 [Blood:200]  Physical exam the patient is somnolent but arousable. He is moving his lower extremities well.  Lab Results: No results for input(s): WBC, HGB, HCT, PLT in the last 72 hours. BMET No results for input(s): NA, K, CL, CO2, GLUCOSE, BUN, CREATININE, CALCIUM in the last 72 hours.  Studies/Results: Dg Lumbar Spine 1 View  07/30/2014   CLINICAL DATA:  Lumbar stenosis  EXAM: LUMBAR SPINE - 1 VIEW  COMPARISON:  Lumbar spine 07/21/2007  FINDINGS: Single lateral view of the lumbar spine submitted. There is a metallic localization instrument just above the spinal process of L3 vertebral body disc space flattening with mild anterior spurring at L3-L4. Mild disc space flattening at L4-L5 and L5-S1 level.  IMPRESSION: Metallic localization instrumented just above of spinal process of L3 vertebral body.   Electronically Signed   By: Natasha MeadLiviu  Pop M.D.   On: 07/30/2014 11:17    Assessment/Plan: The patient is doing well.   LOS: 0 days     Lorance Pickeral D 07/30/2014, 11:52 AM

## 2014-07-30 NOTE — Transfer of Care (Signed)
Immediate Anesthesia Transfer of Care Note  Patient: William Meyer  Procedure(s) Performed: Procedure(s): LUMBAR LAMINECTOMY/DECOMPRESSION MICRODISCECTOMY. LUMBAR TWO/THREE, THREE/FOUR, FOUR/FIVE, FIVE/SACRAL ONE (N/A)  Patient Location: PACU  Anesthesia Type:General  Level of Consciousness: sedated  Airway & Oxygen Therapy: Patient Spontanous Breathing and Patient connected to nasal cannula oxygen  Post-op Assessment: Report given to PACU RN and Post -op Vital signs reviewed and stable  Post vital signs: Reviewed and stable  Complications: No apparent anesthesia complications

## 2014-07-30 NOTE — Progress Notes (Signed)
Pt transported per [facility], NT 

## 2014-07-30 NOTE — Anesthesia Preprocedure Evaluation (Addendum)
Anesthesia Evaluation  Patient identified by MRN, date of birth, ID band Patient awake    Reviewed: Allergy & Precautions, H&P , NPO status , Patient's Chart, lab work & pertinent test results  Airway Mallampati: II   Neck ROM: full    Dental  (+) Teeth Intact,    Pulmonary Current Smoker,          Cardiovascular negative cardio ROS      Neuro/Psych Anxiety  Neuromuscular disease    GI/Hepatic   Endo/Other    Renal/GU      Musculoskeletal  (+) Arthritis -,   Abdominal   Peds  Hematology   Anesthesia Other Findings   Reproductive/Obstetrics                            Anesthesia Physical Anesthesia Plan  ASA: II  Anesthesia Plan: General   Post-op Pain Management:    Induction: Intravenous  Airway Management Planned: Oral ETT  Additional Equipment:   Intra-op Plan:   Post-operative Plan: Extubation in OR  Informed Consent: I have reviewed the patients History and Physical, chart, labs and discussed the procedure including the risks, benefits and alternatives for the proposed anesthesia with the patient or authorized representative who has indicated his/her understanding and acceptance.     Plan Discussed with: CRNA, Anesthesiologist and Surgeon  Anesthesia Plan Comments:         Anesthesia Quick Evaluation

## 2014-07-30 NOTE — H&P (Signed)
Subjective: The patient is a 68 year old black male who has complained of back, buttock and leg pain consistent with neurogenic claudication. He has failed medical management and was worked up with a lumbar MRI which demonstrated multilevel spinal stenosis. We discussed the various treatment options including surgery. He has decided proceed with a laminectomy.   Past Medical History  Diagnosis Date  . Neuromuscular disorder     neuropathy, tremors in R hand at times    . Arthritis     degeneration of spine   . History of Doppler ultrasound     recent doppler studies of lower extremities due to pain.  Pt. told that the results were normal.   . Anxiety     during the night- wakes up with hot flashes because of panic attacks   . Heart murmur     minimal- - pt. told that it was detected on his exit - [facility] [employer] [facility]     Past Surgical History  Procedure Laterality Date  . Back surgery      x2 - lumbar surgeries, cervical fusion x1  . Eye surgery      L cataract /w IOL  . Hernia repair      R side- inguinal   . Knee arthroscopy Left     No Known Allergies  History  Substance Use Topics  . Smoking status: Current Every Day Smoker -- 1.00 packs/day for 40 years  . Smokeless tobacco: Not on file  . Alcohol Use: Yes     Comment: occasional beer    History reviewed. No pertinent family history. Prior to Admission medications   Medication Sig Start Date End Date Taking? Authorizing Provider  cetirizine (ZYRTEC) 10 MG tablet Take 10 mg by mouth daily.   Yes Historical Provider, MD  HYDROcodone-acetaminophen (NORCO/VICODIN) 5-325 MG per tablet Take 1 tablet by mouth 2 (two) times daily.   Yes Historical Provider, MD  Omega-3 Fatty Acids (FISH OIL) 1000 MG CAPS Take 1,000 mg by mouth daily.   Yes Historical Provider, MD     Review of Systems  Positive ROS: as above  All other systems have been reviewed and were otherwise negative with the exception of those mentioned in the  HPI and as above.  Objective: Vital signs in last 24 hours: Temp:  [98.3 F (36.8 C)] 98.3 F (36.8 C) (11/11 0700) Pulse Rate:  [72] 72 (11/11 0700) Resp:  [18] 18 (11/11 0700) BP: (164)/(93) 164/93 mmHg (11/11 0700) SpO2:  [100 %] 100 % (11/11 0700) Weight:  [81.194 kg (179 lb)] 81.194 kg (179 lb) (11/11 0700)  General Appearance: Alert, cooperative, no distress, Head: Normocephalic, without obvious abnormality, atraumatic Eyes: PERRL, conjunctiva/corneas clear, EOM's intact,    Ears: Normal  Throat: Normal  Neck: Supple, symmetrical, trachea midline, no adenopathy; thyroid: No enlargement/tenderness/nodules; no carotid bruit or JVD Back: Symmetric, no curvature, ROM normal, no CVA tenderness. The patient's lumbar incision is well-healed. Lungs: Clear to auscultation bilaterally, respirations unlabored Heart: Regular rate and rhythm, no murmur, rub or gallop Abdomen: Soft, non-tender,, no masses, no organomegaly Extremities: Extremities normal, atraumatic, no cyanosis or edema Pulses: 2+ and symmetric all extremities Skin: Skin color, texture, turgor normal, no rashes or lesions  NEUROLOGIC:   Mental status: alert and oriented, no aphasia, good attention span, Fund of knowledge/ memory ok Motor Exam - grossly normal Sensory Exam - grossly normal Reflexes:  Coordination - grossly normal Gait - grossly normal Balance - grossly normal Cranial Nerves: I: smell Not  tested  II: visual acuity  OS: Normal  OD: Normal   II: visual fields Full to confrontation  II: pupils Equal, round, reactive to light  III,VII: ptosis None  III,IV,VI: extraocular muscles  Full ROM  V: mastication Normal  V: facial light touch sensation  Normal  V,VII: corneal reflex  Present  VII: facial muscle function - upper  Normal  VII: facial muscle function - lower Normal  VIII: hearing Not tested  IX: soft palate elevation  Normal  IX,X: gag reflex Present  XI: trapezius strength  5/5  XI:  sternocleidomastoid strength 5/5  XI: neck flexion strength  5/5  XII: tongue strength  Normal    Data Review Lab Results  Component Value Date   WBC 8.3 07/24/2014   HGB 13.8 07/24/2014   HCT 42.2 07/24/2014   MCV 70.1* 07/24/2014   PLT 239 07/24/2014   Lab Results  Component Value Date   NA 140 07/24/2014   K 4.8 07/24/2014   CL 103 07/24/2014   CO2 27 07/24/2014   BUN 13 07/24/2014   CREATININE 0.91 07/24/2014   GLUCOSE 96 07/24/2014   No results found for: INR, PROTIME  Assessment/Plan: L2-3, L3-4, L4-5 and L5-S1 spinal stenosis lumbago, lumbar radiculopathy, neurogenic claudication: I have discussed the situation with the patient. I reviewed his MRI scan with him and pointed out the abnormalities. We have discussed the various treatment options including surgery. I have described the surgical treatment option of L2-3, L3-4, L4-5 and L5-S1 laminectomy. I have shown him surgical models. We have discussed the risks, benefits, alternatives, and likelihood of achieving her goals with surgery. I have answered all the patient's questions. He has decided to proceed with surgery.   Montrez Marietta D 07/30/2014 8:15 AM

## 2014-07-30 NOTE — Plan of Care (Signed)
Problem: Consults Goal: Diagnosis - Spinal Surgery Outcome: Completed/Met Date Met:  07/30/14 Lumbar Laminectomy (Complex)     

## 2014-07-31 MED ORDER — DSS 100 MG PO CAPS: 100.0000 mg | ORAL_CAPSULE | Freq: Two times a day (BID) | ORAL | Status: DC

## 2014-07-31 MED ORDER — OXYCODONE-ACETAMINOPHEN 5-325 MG PO TABS: 1.0000 | ORAL_TABLET | ORAL | Status: DC | PRN

## 2014-07-31 MED ORDER — DIAZEPAM 5 MG PO TABS: 5.0000 mg | ORAL_TABLET | Freq: Three times a day (TID) | ORAL | Status: DC | PRN

## 2014-07-31 NOTE — Discharge Summary (Signed)
Physician Discharge Summary  Patient ID: William Meyer MRN: [id number] DOB/AGE: Feb 04, 1946 68 y.o.  Admit date: 07/30/2014 Discharge date: 07/31/2014  Admission Diagnoses:L2-3, L3-4, L4-5 and L5-S1 spinal stenosis, lumbago, lumbar radiculopathy, neurogenic claudication  Discharge Diagnoses: the same Active Problems:   Lumbar stenosis with neurogenic claudication   Discharged Condition: good  Hospital Course: I performed an L2-3, L3-4, L4-5 and L5-S1 laminectomy on the patient on 07/30/2014. The surgery went well.  The patient's postoperative course was unremarkable. On postoperative day #1 he requested discharge to home. The patient, and his family, were given oral and written discharge instructions. All their questions were answered.  Consults:PT Significant Diagnostic Studies:none Treatments:L2-3, L3-4, L4-5 and L5-S1 laminectomy using microdissection Discharge Exam: Blood pressure 153/81, pulse 68, temperature 98 F (36.7 C), temperature source Oral, resp. rate 18, weight 81.194 kg (179 lb), SpO2 99 %. The patient is alert and pleasant.he looks well. His dressing is clean and dry. His strength is normal lower extremities.  Disposition: home  Discharge Instructions    Call MD for:  difficulty breathing, headache or visual disturbances    Complete by:  As directed      Call MD for:  extreme fatigue    Complete by:  As directed      Call MD for:  hives    Complete by:  As directed      Call MD for:  persistant dizziness or light-headedness    Complete by:  As directed      Call MD for:  persistant nausea and vomiting    Complete by:  As directed      Call MD for:  redness, tenderness, or signs of infection (pain, swelling, redness, odor or green/yellow discharge around incision site)    Complete by:  As directed      Call MD for:  severe uncontrolled pain    Complete by:  As directed      Call MD for:  temperature >100.4    Complete by:  As directed      Diet - low  sodium heart healthy    Complete by:  As directed      Discharge instructions    Complete by:  As directed   Call [phone] for a followup appointment. Take a stool softener while you are using pain medications.     Driving Restrictions    Complete by:  As directed   Do not drive for 2 weeks.     Increase activity slowly    Complete by:  As directed      Lifting restrictions    Complete by:  As directed   Do not lift more than 5 pounds. No excessive bending or twisting.     May shower / Bathe    Complete by:  As directed   He may shower after the pain she is removed 3 days after surgery. Leave the incision alone.     Remove dressing in 48 hours    Complete by:  As directed   Your stitches are under the scan and will dissolve by themselves. The Steri-Strips will fall off after you take a few showers. Do not rub back or pick at the wound, Leave the wound alone.            Medication List    STOP taking these medications        HYDROcodone-acetaminophen 5-325 MG per tablet  Commonly known as:  NORCO/VICODIN      TAKE these medications  cetirizine 10 MG tablet  Commonly known as:  ZYRTEC  Take 10 mg by mouth daily.     diazepam 5 MG tablet  Commonly known as:  VALIUM  Take 1 tablet (5 mg total) by mouth every 8 (eight) hours as needed for muscle spasms.     DSS 100 MG Caps  Take 100 mg by mouth 2 (two) times daily.     Fish Oil 1000 MG Caps  Take 1,000 mg by mouth daily.     oxyCODONE-acetaminophen 5-325 MG per tablet  Commonly known as:  PERCOCET/ROXICET  Take 1-2 tablets by mouth every 4 (four) hours as needed for moderate pain.         SignedCristi Loron: Bryann Mcnealy D 07/31/2014, 12:55 PM

## 2014-07-31 NOTE — Progress Notes (Signed)
Pt doing well. Pt and family given D/C instructions with Rx's, verbal understanding was provided. Pt's incision is clean and dry, covered with gauze dressing, and has no sign of infection. Pt's Hemovac and IV were D/C'd prior to D/C. Pt D/C'd home via wheelchair @ 1425 per MD order. Pt is stable @ D/C and has no other needs at this time. Rema FendtAshley Eliel Dudding, RN

## 2014-07-31 NOTE — Progress Notes (Signed)
Physical Therapy Evaluation Patient Details Name: William Meyer MRN: [id number] DOB: 09/09/1946 Today's Date: 07/31/2014   History of Present Illness   Patient is a 68 year old black male who has complained of back, buttock and leg pain consistent with neurogenic claudication. He has had previous lumbar surgery. He was worked up with a lumbar MRI which demonstrated multilevel stenosis.I discussed the various treatment options with the patient including surgery. He has weighed the risks, benefits, and alternative surgery and decided proceed with a decompressive laminectomy.   Clinical Impression  Pt currently with functional limitations due to decreased strength, mobility and balance. Pt will benefit from skilled PT to increase independence and safety with mobility to allow discharge to home with family assistance. Pt demonstrates safe mobility getting into and out of bed. Pt was safe with ambulation but was educated on the importance of using his walker if he feels he needs it. Pt stated he is good about using his walker. Pt able to recite back precautions with cuing.      Follow Up Recommendations No PT follow up    Equipment Recommendations  None recommended by PT    Recommendations for Other Services       Precautions / Restrictions Precautions Precautions: Back;Fall Precaution Booklet Issued: Yes (comment) Restrictions Weight Bearing Restrictions: No      Mobility  Bed Mobility Overal bed mobility: Independent             General bed mobility comments: Pt able to perform bed mobility without physical assistance from PT. Pt was reminded of his precautions prior to getting out of bed and was able to follow them without assistance.   Transfers Overall transfer level: Needs assistance Equipment used: None Transfers: Sit to/from Stand Sit to Stand: Min guard         General transfer comment: Pt able to perform sit to stand from lowered bed min guard from PT for  safety.  Ambulation/Gait Ambulation/Gait assistance: Min guard Ambulation Distance (Feet): 360 Feet Assistive device: None Gait Pattern/deviations: Decreased stride length;Step-through pattern;Decreased stance time - left   Gait velocity interpretation: Below normal speed for age/gender General Gait Details: Pt able to ambulate in hall with min guard assist without assistive device. Pt had a walker at home that he uses when he feels he needs it.   Stairs Stairs: Yes Stairs assistance: Min guard Stair Management: No rails;Step to pattern;Forwards Number of Stairs: 4 General stair comments: Pt able to negotiate stairs with min assist for safety.   Wheelchair Mobility    Modified Rankin (Stroke Patients Only)       Balance Overall balance assessment: Needs assistance Sitting-balance support: Feet supported;No upper extremity supported Sitting balance-Leahy Scale: Good     Standing balance support: During functional activity;No upper extremity supported Standing balance-Leahy Scale: Fair                               Pertinent Vitals/Pain Pain Assessment: 0-10 Pain Score: 5  Pain Location: Incision on back  Pain Intervention(s): Limited activity within patient's tolerance;Monitored during session;Repositioned    Home Living Family/patient expects to be discharged to:: Private residence Living Arrangements: Spouse/significant other Available Help at Discharge: Family Type of Home: House Home Access: Stairs to enter Entrance Stairs-Rails: None Entrance Stairs-Number of Steps: 3 Home Layout: Two level;Able to live on main level with bedroom/bathroom Home Equipment: Gilmer MorCane - single point;Walker - 2 wheels      Prior  Function Level of Independence: Independent               Hand Dominance        Extremity/Trunk Assessment               Lower Extremity Assessment: Overall WFL for tasks assessed         Communication   Communication: No  difficulties  Cognition Arousal/Alertness: Awake/alert Behavior During Therapy: WFL for tasks assessed/performed Overall Cognitive Status: Within Functional Limits for tasks assessed                      General Comments      Exercises        Assessment/Plan    PT Assessment Patient needs continued PT services  PT Diagnosis Difficulty walking;Acute pain   PT Problem List Decreased strength;Decreased activity tolerance;Decreased mobility;Decreased balance;Pain  PT Treatment Interventions Gait training;Stair training;Functional mobility training;Therapeutic activities;Therapeutic exercise;Balance training;Patient/family education   PT Goals (Current goals can be found in the Care Plan section) Acute Rehab PT Goals Patient Stated Goal: Return home PT Goal Formulation: With patient Time For Goal Achievement: 08/14/14 Potential to Achieve Goals: Good    Frequency Min 3X/week   Barriers to discharge        Co-evaluation               End of Session Equipment Utilized During Treatment: Gait belt Activity Tolerance: Patient tolerated treatment well;Patient limited by pain Patient left: in bed;with call bell/phone within reach;with family/visitor present           Time: 1138-1203 PT Time Calculation (min) (ACUTE ONLY): 25 min   Charges:         PT G CodesYork Spaniel:          Hebert, Clarke Peretz, SPT 07/31/2014, 1:22 PM   York Spaniellivia Hebert, SPT  Acute Rehabilitation [phone]

## 2014-07-31 NOTE — Discharge Instructions (Signed)
Call MD for: Difficulty breathing, headache or visual disturbances, extreme fatigue  °Call MD for: hives ° Call MD for: persistant dizziness or light-headedness ° Call MD for: persistant nausea and vomiting  °Call MD for: redness, tenderness, or signs of infection (pain, swelling, redness, odor or green/yellow discharge around incision site)  °Call MD for: severe uncontrolled pain  °Call MD for: temperature >100.4 Diet - low sodium heart healthy Discharge instructions  ° Call [phone] for a followup appointment. °  °Increase activity slowly Remove dressing in 48 hours  ° ° ° ° °Laminectomy - Laminotomy - Discectomy °Your surgeon has decided that a laminectomy (entire lamina removal) or laminotomy (partial lamina removal) is the best treatment for your back problem. These procedures involve removal of bone to relieve pressure on nerve roots. It allows the surgeon access to parts of the spine where other problems are located. This could be an injured disc (the cartilage-like structures located between the bones of the back). In this surgery your surgeon removes a part of the boney arch that surrounds your spinal canal. This may be compressing nerve roots. In some cases, the surgeon will remove the disc and fuse (stick together) vertebral bodies (the bones of your back) to make the spine more stable. The type of procedure you will need is usually decided prior to surgery, however modifications may be necessary. The time in surgery depends on the findings in surgery and the procedure necessary to correct the problems. °DISCECTOMY °For people with disc problems, the surgeon removes the portion of the disc that is causing the pressure on the nerve root. Some surgeons perform a micro (small) discectomy, which may require removal of only a small portion of the lamina. A disc nucleus (center) may also be removed either through a needle (percutaneous discectomy) or by injecting an enzyme called chymopapain into the disc.  Chymopapain is an enzyme that dissolves the disc. For people with back instability, the surgeon fuses vertebrae that are next to each other with tiny pieces of bone. These are used as bone grafts on the facets, or between the vertebrae. When this heals, the bones will no longer be able to move. These bone chips are often taken from the pelvic bones. Bones and bone grafts grow into one unit, stabilizing the segments of the spinal column. °LET YOUR CAREGIVER KNOW ABOUT: °· Allergies. °· Medicines taken including herbs, eyedrops, over-the-counter medicines, and creams. °· Use of steroids (by mouth or creams). °· Previous problems with anesthetics or numbing medicine. °· Possibility of pregnancy, if this applies. °· History of blood clots (thrombophlebitis). °· History of bleeding or blood problems. °· Previous surgery. °· Other health problems. °RISKS AND COMPLICATIONS °Your caregiver will discuss possible risks and complications with you before surgery. In addition to the usual risks of anesthesia, other common risks and complications include: °· Blood loss and replacement. °· Temporary increase in pain due to surgery. °· Uncorrected back pain. °· Infection. °· New nerve damage (tingling, numbness, and pain). °BEFORE THE PROCEDURE °· Stop smoking at least 1 week prior to surgery. This lowers risk during surgery. °· Your caregiver may advise that you stop taking certain medicines that may affect the outcome of the surgery and your ability to heal. For example, you may need to stop taking anti-inflammatories, such as aspirin, because of possible bleeding problems. Other medicines may have interactions with anesthesia. °· Tell your caregiver if you have been on steroids for long periods of time. Often, additional steroids are administered intravenously   before and during the procedure to prevent complications. °· You should be present 60 minutes prior to your procedure or as directed. °AFTER THE PROCEDURE °After surgery,  you will be taken to the recovery area where a nurse will watch and check your progress. Generally, you will be allowed to go home within 1 week barring other problems. °HOME CARE INSTRUCTIONS  °· Check the surgical cut (incision) twice a day for signs of infection. Some signs may include a bad smelling, greenish or yellowish discharge from the wound; increased pain or increased redness over the incision site; an opening of the incision; flu-like symptoms; or a temperature above 101.5° F (38.6° C). °· Change your bandages in about 24 to 36 hours following surgery or as directed. °· You may shower once the bandage is removed or as directed. Avoid bathtubs, swimming pools, and hot tubs for 3 weeks or until your incision has healed completely. If you have stitches (sutures) or staples they may be removed 2 to 3 weeks after surgery, or as directed by your caregiver. °· Follow your caregiver's instructions for activities, exercises, and physical therapy. °· Weight reduction may be beneficial if you are overweight. °· Walking is permitted. You may use a treadmill without an incline. Cut down on activities if you have discomfort. You may also go up and down stairs as tolerated. °· Do not lift anything heavier than 10 to 15 pounds. Avoid bending or twisting at the waist. Always bend your knees. °· Maintain strength and range of motion as instructed. °· No driving is permitted for 2 to 3 weeks, or as directed by your caregiver. You may be a passenger for 20 to 30 minute trips. Lying back in the passenger seat may be more comfortable for you. °· Limit your sitting to 20 to 30 minute intervals. You should lie down or walk in between sitting periods. There are no limitations for sitting in a recliner chair. °· Only take over-the-counter or prescription medicines for pain, discomfort, or fever as directed by your caregiver. °SEEK MEDICAL CARE IF:  °· There is increased bleeding (more than a small spot) from the wound. °· You  notice redness, swelling, or increasing pain in the wound. °· Pus is coming from the wound. °· An unexplained oral temperature above 102° F (38.9° C) develops. °· You notice a bad smell coming from the wound or dressing. °SEEK IMMEDIATE MEDICAL CARE IF:  °· You develop a rash. °· You have difficulty breathing. °· You have any allergic problems. °Document Released: 09/02/2000 Document Revised: 01/20/2014 Document Reviewed: 07/01/2008 °[employer]® Patient Information ©2015 [employer]. This information is not intended to replace advice given to you by your health care provider. Make sure you discuss any questions you have with your health care provider. ° °

## 2014-08-04 ENCOUNTER — Encounter (HOSPITAL_COMMUNITY): Payer: Self-pay | Admitting: Neurosurgery

## 2018-07-06 DIAGNOSIS — E785 Hyperlipidemia, unspecified: Secondary | ICD-10-CM | POA: Diagnosis not present

## 2018-07-06 DIAGNOSIS — I10 Essential (primary) hypertension: Secondary | ICD-10-CM | POA: Diagnosis not present

## 2018-07-06 DIAGNOSIS — Z6824 Body mass index (BMI) 24.0-24.9, adult: Secondary | ICD-10-CM | POA: Diagnosis not present

## 2018-07-06 DIAGNOSIS — M545 Low back pain: Secondary | ICD-10-CM | POA: Diagnosis not present

## 2018-09-17 ENCOUNTER — Ambulatory Visit
Admission: EM | Admit: 2018-09-17 | Discharge: 2018-09-17 | Disposition: A | Discharge status: Home / Self Care | Payer: Medicare PPO

## 2018-09-17 DIAGNOSIS — R109 Unspecified abdominal pain: Secondary | ICD-10-CM | POA: Diagnosis not present

## 2018-09-17 HISTORY — DX: Pure hypercholesterolemia, unspecified: E78.00

## 2018-09-17 HISTORY — DX: Essential (primary) hypertension: I10

## 2018-09-17 LAB — POCT URINALYSIS DIP (MANUAL ENTRY)
Bilirubin, UA: NEGATIVE
Glucose, UA: NEGATIVE mg/dL
Leukocytes, UA: NEGATIVE
Nitrite, UA: NEGATIVE
PROTEIN UA: NEGATIVE mg/dL
RBC UA: NEGATIVE
Spec Grav, UA: >=1.03 — AB (ref 1.010–1.025)
UROBILINOGEN UA: 1 U/dL
pH, UA: 5.5 (ref 5.0–8.0)

## 2018-09-17 NOTE — ED Triage Notes (Signed)
Pt c/o rt lower abdominal pain radiating to rt groin for over a month after moving bricks; hx of hernia operation 5878yrs ago

## 2018-09-17 NOTE — Discharge Instructions (Addendum)
Urine was negative for any infection There is no blood in the urine which makes me think there is no kidney stone. You are having some right lower quadrant discomfort.  There is a possibility of some appendix issue I would keep monitoring your symptoms and if your symptoms worsen you need to go to the hospital for further imaging Otherwise I am giving you a contact for primary care for further evaluation and management

## 2018-09-19 NOTE — ED Provider Notes (Signed)
[facility]    CSN: [id number] Arrival date & time: 09/17/18  1129     History   Chief Complaint Chief Complaint  Patient presents with  . Groin Pain    HPI William Meyer is a 73 y.o. male.   Patient is a 73 year old male with past medical history of anxiety, arthritis, high cholesterol, hypertension.  He presents with approximately 1 month of right lower quadrant/flank/groin pain.  Symptoms have been waxing and waning.  Moving and bending does make the pain worse.  He denies any urinary symptoms but reports some intermittent testicle swelling and pressure under the testicles.This did start after moving some bricks a month ago.    He denies any fevers, chills, night sweats, nausea, vomiting, diarrhea.  He denies taking anything for his symptoms.  He does have a history of lower lumbar laminectomy/decompression.  Sometimes the pain does radiate into his lower back area.  He does have numbness, tingling at times that radiates into the right leg but reports this is not a new symptom.  Denies any loss of bowel or bladder.  Denies any saddle paresthesias.  ROS per HPI        Past Medical History:  Diagnosis Date  . Anxiety    during the night- wakes up with hot flashes because of panic attacks   . Arthritis    degeneration of spine   . Heart murmur    minimal- - pt. told that it was detected on his exit - [facility]- from [employer]   . High cholesterol   . History of Doppler ultrasound    recent doppler studies of lower extremities due to pain.  Pt. told that the results were normal.   . Hypertension   . Neuromuscular disorder (HCC)    neuropathy, tremors in R hand at times      Patient Active Problem List   Diagnosis Date Noted  . 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 BACK SURGERY     x2 - lumbar surgeries, cervical fusion x1  . EYE SURGERY     L cataract /w IOL  . HERNIA REPAIR       R side- inguinal   . KNEE ARTHROSCOPY Left   . LUMBAR LAMINECTOMY/DECOMPRESSION MICRODISCECTOMY N/A 07/30/2014   Procedure: LUMBAR LAMINECTOMY/DECOMPRESSION MICRODISCECTOMY. LUMBAR TWO/THREE, THREE/FOUR, FOUR/FIVE, FIVE/SACRAL ONE;  Surgeon: Tressie Stalker, MD;  Location: [facility];  Service: Neurosurgery;  Laterality: N/A;       Home Medications    Prior to Admission medications   Medication Sig Start Date End Date Taking? Authorizing Provider  amLODipine (NORVASC) 5 MG tablet Take 5 mg by mouth daily.   Yes [provider]  primidone (MYSOLINE) 50 MG tablet Take by mouth 4 (four) times daily.   Yes [provider]  cetirizine (ZYRTEC) 10 MG tablet Take 10 mg by mouth daily.    [provider]  diazepam (VALIUM) 5 MG tablet Take 1 tablet (5 mg total) by mouth every 8 (eight) hours as needed for muscle spasms. 07/31/14   Tressie Stalker, MD  docusate sodium 100 MG CAPS Take 100 mg by mouth 2 (two) times daily. 07/31/14   Tressie Stalker, MD  Omega-3 Fatty Acids (FISH OIL) 1000 MG CAPS Take 1,000 mg by mouth daily.    [provider]  oxyCODONE-acetaminophen (PERCOCET/ROXICET) 5-325 MG per tablet Take 1-2 tablets by mouth every 4 (four) hours as needed for  moderate pain. 07/31/14   Tressie Stalker, MD    Family History No family history on file.  Social History Social History   Tobacco Use  . Smoking status: Current Every Day Smoker    Packs/day: 1.00    Years: 40.00    Pack years: 40.00  . Smokeless tobacco: Never Used  Substance Use Topics  . Alcohol use: Yes    Comment: occasional beer  . Drug use: No     Allergies   Patient has no known allergies.   Review of Systems Review of Systems   Physical Exam Triage Vital Signs ED Triage Vitals [09/17/18 1300]  Enc Vitals Group     BP (!) 154/84     Pulse Rate 74     Resp 18     Temp 98.3 F (36.8 C)     Temp Source Oral     SpO2 97 %     Weight      Height       Head Circumference      Peak Flow      Pain Score 7     Pain Loc      Pain Edu?      Excl. in GC?    No data found.  Updated Vital Signs BP (!) 154/84 (BP Location: Left Arm)   Pulse 74   Temp 98.3 F (36.8 C) (Oral)   Resp 18   SpO2 97%   Visual Acuity Right Eye Distance:   Left Eye Distance:   Bilateral Distance:    Right Eye Near:   Left Eye Near:    Bilateral Near:     Physical Exam Vitals signs and nursing note reviewed.  Constitutional:      Appearance: Normal appearance.  HENT:     Head: Normocephalic and atraumatic.     Nose: Nose normal.  Eyes:     Conjunctiva/sclera: Conjunctivae normal.  Neck:     Musculoskeletal: Normal range of motion.  Pulmonary:     Effort: Pulmonary effort is normal.  Abdominal:     Palpations: Abdomen is soft.     Tenderness: There is abdominal tenderness. There is no right CVA tenderness or left CVA tenderness.     Comments: There is mild tenderness to the right flank and right lower quadrant area.  No rebound tenderness or masses felt.  Musculoskeletal: Normal range of motion.        General: Tenderness present.     Comments: Mild tenderness to right lower lumbar paravertebral musculature.  Skin:    General: Skin is warm and dry.  Neurological:     Mental Status: He is alert.  Psychiatric:        Mood and Affect: Mood normal.      UC Treatments / Results  Labs (all labs ordered are listed, but only abnormal results are displayed) Labs Reviewed  POCT URINALYSIS DIP (MANUAL ENTRY) - Abnormal; Notable for the following components:      Result Value   Ketones, POC UA trace (5) (*)    Spec Grav, UA >=1.030 (*)    All other components within normal limits    EKG None  Radiology No results found.  Procedures Procedures (including critical care time)  Medications Ordered in UC Medications - No data to display  Initial Impression / Assessment and Plan / UC Course  I have reviewed the triage vital signs and the  nursing notes.  Pertinent labs & imaging results that were available during my  care of the patient were reviewed by me and considered in my medical decision making (see chart for details).     Urine negative for infection.  Less likely symptoms are related to UTI, kidney stone, epididymitis, prostatitis. Most likely musculoskeletal in nature.  He did have some mild right lower quadrant tenderness and right flank.  No rebound tenderness or masses. Not really concerned for appendicitis at this time. I believe safe to try some anti-inflammatory pain medication and monitor for worsening symptoms.  If his symptoms do not get better or worsen he will need to follow with his primary care provider for further evaluation and management. Patient understanding and agreeable to plan Final Clinical Impressions(s) / UC Diagnoses   Final diagnoses:  Flank pain     Discharge Instructions     Urine was negative for any infection There is no blood in the urine which makes me think there is no kidney stone. You are having some right lower quadrant discomfort.  There is a possibility of some appendix issue I would keep monitoring your symptoms and if your symptoms worsen you need to go to the hospital for further imaging Otherwise I am giving you a contact for primary care for further evaluation and management    ED Prescriptions    None     Controlled Substance Prescriptions [place] Controlled Substance Registry consulted? Not Applicable   Janace ArisBast, Roxine Whittinghill A, NP 09/19/18 1552

## 2018-10-30 DIAGNOSIS — M5126 Other intervertebral disc displacement, lumbar region: Secondary | ICD-10-CM | POA: Diagnosis not present

## 2018-10-30 DIAGNOSIS — M5416 Radiculopathy, lumbar region: Secondary | ICD-10-CM | POA: Diagnosis not present

## 2019-03-25 DIAGNOSIS — M5416 Radiculopathy, lumbar region: Secondary | ICD-10-CM | POA: Diagnosis not present

## 2019-03-25 DIAGNOSIS — M5386 Other specified dorsopathies, lumbar region: Secondary | ICD-10-CM | POA: Diagnosis not present

## 2019-03-25 DIAGNOSIS — Z4789 Encounter for other orthopedic aftercare: Secondary | ICD-10-CM | POA: Diagnosis not present

## 2019-03-25 DIAGNOSIS — G250 Essential tremor: Secondary | ICD-10-CM | POA: Diagnosis not present

## 2019-03-28 DIAGNOSIS — M5416 Radiculopathy, lumbar region: Secondary | ICD-10-CM | POA: Diagnosis not present

## 2019-03-28 DIAGNOSIS — Z4789 Encounter for other orthopedic aftercare: Secondary | ICD-10-CM | POA: Diagnosis not present

## 2019-03-28 DIAGNOSIS — M5386 Other specified dorsopathies, lumbar region: Secondary | ICD-10-CM | POA: Diagnosis not present

## 2019-03-28 DIAGNOSIS — G250 Essential tremor: Secondary | ICD-10-CM | POA: Diagnosis not present

## 2019-04-02 DIAGNOSIS — M5416 Radiculopathy, lumbar region: Secondary | ICD-10-CM | POA: Diagnosis not present

## 2019-04-02 DIAGNOSIS — M5386 Other specified dorsopathies, lumbar region: Secondary | ICD-10-CM | POA: Diagnosis not present

## 2019-04-02 DIAGNOSIS — G250 Essential tremor: Secondary | ICD-10-CM | POA: Diagnosis not present

## 2019-04-02 DIAGNOSIS — Z4789 Encounter for other orthopedic aftercare: Secondary | ICD-10-CM | POA: Diagnosis not present

## 2019-04-05 DIAGNOSIS — M5416 Radiculopathy, lumbar region: Secondary | ICD-10-CM | POA: Diagnosis not present

## 2019-04-05 DIAGNOSIS — Z4789 Encounter for other orthopedic aftercare: Secondary | ICD-10-CM | POA: Diagnosis not present

## 2019-04-05 DIAGNOSIS — M5386 Other specified dorsopathies, lumbar region: Secondary | ICD-10-CM | POA: Diagnosis not present

## 2019-04-05 DIAGNOSIS — G250 Essential tremor: Secondary | ICD-10-CM | POA: Diagnosis not present

## 2019-04-09 DIAGNOSIS — Z4789 Encounter for other orthopedic aftercare: Secondary | ICD-10-CM | POA: Diagnosis not present

## 2019-04-09 DIAGNOSIS — M5416 Radiculopathy, lumbar region: Secondary | ICD-10-CM | POA: Diagnosis not present

## 2019-04-09 DIAGNOSIS — M5386 Other specified dorsopathies, lumbar region: Secondary | ICD-10-CM | POA: Diagnosis not present

## 2019-04-09 DIAGNOSIS — G250 Essential tremor: Secondary | ICD-10-CM | POA: Diagnosis not present

## 2019-04-11 DIAGNOSIS — M5416 Radiculopathy, lumbar region: Secondary | ICD-10-CM | POA: Diagnosis not present

## 2019-04-11 DIAGNOSIS — G250 Essential tremor: Secondary | ICD-10-CM | POA: Diagnosis not present

## 2019-04-11 DIAGNOSIS — M5386 Other specified dorsopathies, lumbar region: Secondary | ICD-10-CM | POA: Diagnosis not present

## 2019-04-11 DIAGNOSIS — Z4789 Encounter for other orthopedic aftercare: Secondary | ICD-10-CM | POA: Diagnosis not present

## 2019-04-15 DIAGNOSIS — Z4789 Encounter for other orthopedic aftercare: Secondary | ICD-10-CM | POA: Diagnosis not present

## 2019-04-15 DIAGNOSIS — M5416 Radiculopathy, lumbar region: Secondary | ICD-10-CM | POA: Diagnosis not present

## 2019-04-15 DIAGNOSIS — G250 Essential tremor: Secondary | ICD-10-CM | POA: Diagnosis not present

## 2019-04-15 DIAGNOSIS — M5386 Other specified dorsopathies, lumbar region: Secondary | ICD-10-CM | POA: Diagnosis not present

## 2019-04-18 DIAGNOSIS — M5416 Radiculopathy, lumbar region: Secondary | ICD-10-CM | POA: Diagnosis not present

## 2019-04-18 DIAGNOSIS — G250 Essential tremor: Secondary | ICD-10-CM | POA: Diagnosis not present

## 2019-04-18 DIAGNOSIS — Z4789 Encounter for other orthopedic aftercare: Secondary | ICD-10-CM | POA: Diagnosis not present

## 2019-04-18 DIAGNOSIS — M5386 Other specified dorsopathies, lumbar region: Secondary | ICD-10-CM | POA: Diagnosis not present

## 2019-06-05 DIAGNOSIS — M5136 Other intervertebral disc degeneration, lumbar region: Secondary | ICD-10-CM | POA: Diagnosis not present

## 2019-06-05 DIAGNOSIS — M48061 Spinal stenosis, lumbar region without neurogenic claudication: Secondary | ICD-10-CM | POA: Diagnosis not present

## 2019-06-05 DIAGNOSIS — M5126 Other intervertebral disc displacement, lumbar region: Secondary | ICD-10-CM | POA: Diagnosis not present

## 2019-07-06 DIAGNOSIS — G250 Essential tremor: Secondary | ICD-10-CM | POA: Diagnosis not present

## 2019-07-06 DIAGNOSIS — I10 Essential (primary) hypertension: Secondary | ICD-10-CM | POA: Diagnosis not present

## 2019-07-06 DIAGNOSIS — M545 Low back pain: Secondary | ICD-10-CM | POA: Diagnosis not present

## 2019-07-06 DIAGNOSIS — F17200 Nicotine dependence, unspecified, uncomplicated: Secondary | ICD-10-CM | POA: Diagnosis not present

## 2019-07-06 DIAGNOSIS — M5116 Intervertebral disc disorders with radiculopathy, lumbar region: Secondary | ICD-10-CM | POA: Diagnosis not present

## 2019-07-06 DIAGNOSIS — Z6825 Body mass index (BMI) 25.0-25.9, adult: Secondary | ICD-10-CM | POA: Diagnosis not present

## 2019-07-06 DIAGNOSIS — G629 Polyneuropathy, unspecified: Secondary | ICD-10-CM | POA: Diagnosis not present

## 2019-07-06 DIAGNOSIS — E785 Hyperlipidemia, unspecified: Secondary | ICD-10-CM | POA: Diagnosis not present

## 2019-07-06 DIAGNOSIS — R1031 Right lower quadrant pain: Secondary | ICD-10-CM | POA: Diagnosis not present

## 2019-10-31 ENCOUNTER — Ambulatory Visit: Payer: Medicare PPO | Attending: Internal Medicine

## 2019-10-31 DIAGNOSIS — Z23 Encounter for immunization: Secondary | ICD-10-CM | POA: Insufficient documentation

## 2019-10-31 NOTE — Progress Notes (Signed)
   Covid-19 Vaccination Clinic  Name:  William Meyer    MRN: [id number] DOB: 05/24/1946  10/31/2019  Mr. William Meyer was observed post Covid-19 immunization for 15 minutes without incidence. He was provided with Vaccine Information Sheet and instruction to access the V-Safe system.   Mr. William Meyer was instructed to call 911 with any severe reactions post vaccine: Marland Kitchen Difficulty breathing  . Swelling of your face and throat  . A fast heartbeat  . A bad rash all over your body  . Dizziness and weakness    Immunizations Administered    Name Date Dose VIS Date Route   Pfizer COVID-19 Vaccine 10/31/2019  1:09 PM 0.3 mL 08/30/2019 Intramuscular   Manufacturer: [employer], [employer]   Lot: [id number]   NDC: [address]-2

## 2019-11-23 ENCOUNTER — Ambulatory Visit: Payer: Medicare PPO | Attending: Internal Medicine

## 2019-11-23 DIAGNOSIS — Z23 Encounter for immunization: Secondary | ICD-10-CM | POA: Insufficient documentation

## 2019-11-23 NOTE — Progress Notes (Signed)
   Covid-19 Vaccination Clinic  Name:  OMARIAN JAQUITH    MRN: [id number] DOB: [birth year]  11/23/2019  Mr. Plemmons was observed post Covid-19 immunization for 15 minutes without incident. He was provided with Vaccine Information Sheet and instruction to access the V-Safe system.   Mr. Karen was instructed to call 911 with any severe reactions post vaccine: Marland Kitchen Difficulty breathing  . Swelling of face and throat  . A fast heartbeat  . A bad rash all over body  . Dizziness and weakness   Immunizations Administered    Name Date Dose VIS Date Route   Pfizer COVID-19 Vaccine 11/23/2019 12:40 PM 0.3 mL 08/30/2019 Intramuscular   Manufacturer: [employer], [employer]   Lot: [id number]   NDC: [address]-5

## 2020-02-11 ENCOUNTER — Emergency Department (HOSPITAL_COMMUNITY): Payer: No Typology Code available for payment source

## 2020-02-11 ENCOUNTER — Other Ambulatory Visit: Payer: Self-pay

## 2020-02-11 ENCOUNTER — Encounter (HOSPITAL_COMMUNITY): Payer: Self-pay | Admitting: Emergency Medicine

## 2020-02-11 ENCOUNTER — Emergency Department (HOSPITAL_COMMUNITY)
Admission: EM | Admit: 2020-02-11 | Discharge: 2020-02-12 | Disposition: A | Discharge status: Home / Self Care | Payer: No Typology Code available for payment source | Attending: Emergency Medicine | Admitting: Emergency Medicine

## 2020-02-11 DIAGNOSIS — Z79899 Other long term (current) drug therapy: Secondary | ICD-10-CM | POA: Insufficient documentation

## 2020-02-11 DIAGNOSIS — M542 Cervicalgia: Secondary | ICD-10-CM | POA: Diagnosis not present

## 2020-02-11 DIAGNOSIS — E782 Mixed hyperlipidemia: Secondary | ICD-10-CM | POA: Diagnosis not present

## 2020-02-11 DIAGNOSIS — M545 Low back pain: Secondary | ICD-10-CM | POA: Diagnosis not present

## 2020-02-11 DIAGNOSIS — F17210 Nicotine dependence, cigarettes, uncomplicated: Secondary | ICD-10-CM | POA: Diagnosis not present

## 2020-02-11 DIAGNOSIS — M25511 Pain in right shoulder: Secondary | ICD-10-CM | POA: Insufficient documentation

## 2020-02-11 DIAGNOSIS — M25531 Pain in right wrist: Secondary | ICD-10-CM | POA: Diagnosis not present

## 2020-02-11 DIAGNOSIS — I10 Essential (primary) hypertension: Secondary | ICD-10-CM | POA: Insufficient documentation

## 2020-02-11 LAB — CBC
HCT: 42.3 % (ref 39.0–52.0)
Hemoglobin: 12.8 g/dL — ABNORMAL LOW (ref 13.0–17.0)
MCH: 22.6 pg — ABNORMAL LOW (ref 26.0–34.0)
MCHC: 30.3 g/dL (ref 30.0–36.0)
MCV: 74.7 fL — ABNORMAL LOW (ref 80.0–100.0)
Platelets: 254 10*3/uL (ref 150–400)
RBC: 5.66 MIL/uL (ref 4.22–5.81)
RDW: 15.2 % (ref 11.5–15.5)
WBC: 8.1 10*3/uL (ref 4.0–10.5)
nRBC: 0 % (ref 0.0–0.2)

## 2020-02-11 LAB — BASIC METABOLIC PANEL
Anion gap: 10 (ref 5–15)
BUN: 10 mg/dL (ref 8–23)
CO2: 26 mmol/L (ref 22–32)
Calcium: 9.2 mg/dL (ref 8.9–10.3)
Chloride: 107 mmol/L (ref 98–111)
Creatinine, Ser: 0.89 mg/dL (ref 0.61–1.24)
GFR calc Af Amer: >60 mL/min (ref >60)
GFR calc non Af Amer: >60 mL/min (ref >60)
Glucose, Bld: 111 mg/dL — ABNORMAL HIGH (ref 70–99)
Potassium: 3.7 mmol/L (ref 3.5–5.1)
Sodium: 143 mmol/L (ref 135–145)

## 2020-02-11 MED ORDER — SODIUM CHLORIDE 0.9% FLUSH: 3.0000 mL | Freq: Once | INTRAVENOUS | Status: DC
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
## 2020-02-11 NOTE — ED Notes (Signed)
Pt stated they were stepping outside  

## 2020-02-12 ENCOUNTER — Emergency Department (HOSPITAL_COMMUNITY): Payer: No Typology Code available for payment source

## 2020-02-12 ENCOUNTER — Emergency Department (HOSPITAL_COMMUNITY)
Admission: EM | Admit: 2020-02-12 | Discharge: 2020-02-12 | Disposition: A | Discharge status: Home / Self Care | Payer: No Typology Code available for payment source | Source: Home / Self Care | Attending: Emergency Medicine | Admitting: Emergency Medicine

## 2020-02-12 ENCOUNTER — Encounter (HOSPITAL_COMMUNITY): Payer: Self-pay

## 2020-02-12 ENCOUNTER — Other Ambulatory Visit: Payer: Self-pay

## 2020-02-12 MED ORDER — LIDOCAINE 5 % EX PTCH
1.0000 | MEDICATED_PATCH | Freq: Once | CUTANEOUS | Status: DC
  Administered 2020-02-12: 1 via TRANSDERMAL
  Filled 2020-02-12: qty 1

## 2020-02-12 MED ORDER — DICLOFENAC SODIUM 1 % EX GEL: 2.0000 g | Freq: Four times a day (QID) | CUTANEOUS | 0 refills | Status: DC
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
## 2020-02-12 NOTE — ED Notes (Signed)
Called Pt for vitals no answer. 

## 2020-02-12 NOTE — ED Notes (Signed)
Patient verbalizes understanding of discharge instructions. Opportunity for questioning and answers were provided. Pt discharged from ED. 
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
## 2020-05-08 ENCOUNTER — Other Ambulatory Visit: Payer: Self-pay

## 2020-05-08 ENCOUNTER — Ambulatory Visit
Admission: EM | Admit: 2020-05-08 | Discharge: 2020-05-08 | Disposition: A | Discharge status: Home / Self Care | Payer: Medicare PPO | Attending: Physician Assistant | Admitting: Physician Assistant

## 2020-05-08 DIAGNOSIS — G8929 Other chronic pain: Secondary | ICD-10-CM | POA: Diagnosis not present

## 2020-05-08 DIAGNOSIS — M25511 Pain in right shoulder: Secondary | ICD-10-CM | POA: Diagnosis not present

## 2020-05-08 MED ORDER — PREDNISONE 50 MG PO TABS: 50.0000 mg | ORAL_TABLET | Freq: Every day | ORAL | 0 refills | Status: AC
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
## 2020-05-08 NOTE — Discharge Instructions (Signed)
Start prednisone as directed. Ice compress to the shoulder. Follow up with sports medicine if symptoms not improving.
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
## 2020-08-21 ENCOUNTER — Ambulatory Visit: Payer: No Typology Code available for payment source | Attending: Internal Medicine

## 2020-08-21 DIAGNOSIS — Z23 Encounter for immunization: Secondary | ICD-10-CM

## 2020-08-21 NOTE — Progress Notes (Signed)
   Covid-19 Vaccination Clinic  Name:  SYED ZUKAS    MRN: [id number] DOB: [birth year]  08/21/2020  Mr. Schleyer was observed post Covid-19 immunization for 15 minutes without incident. He was provided with Vaccine Information Sheet and instruction to access the V-Safe system.   Mr. Burling was instructed to call 911 with any severe reactions post vaccine: Marland Kitchen Difficulty breathing  . Swelling of face and throat  . A fast heartbeat  . A bad rash all over body  . Dizziness and weakness   Immunizations Administered    Name Date Dose VIS Date Route   Pfizer COVID-19 Vaccine 08/21/2020  2:22 PM 0.3 mL 07/08/2020 Intramuscular   Manufacturer: [employer], [employer]   Lot: [id number]   NDC: [address]-7
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
## 2022-07-26 ENCOUNTER — Ambulatory Visit: Payer: No Typology Code available for payment source | Attending: Orthopedic Surgery

## 2022-07-26 DIAGNOSIS — G8929 Other chronic pain: Secondary | ICD-10-CM | POA: Insufficient documentation

## 2022-07-26 DIAGNOSIS — M25511 Pain in right shoulder: Secondary | ICD-10-CM | POA: Diagnosis not present

## 2022-07-26 DIAGNOSIS — M6281 Muscle weakness (generalized): Secondary | ICD-10-CM | POA: Insufficient documentation

## 2022-07-26 NOTE — Therapy (Signed)
OUTPATIENT PHYSICAL THERAPY SHOULDER EVALUATION   Patient Name: William Meyer MRN: [id number] DOB:March 29, 1946, 76 y.o., male Today's Date: 07/26/2022   PT End of Session - 07/26/22 1543     Visit Number 1    Number of Visits 17    Date for PT Re-Evaluation 09/27/22    Authorization Type [facility]    Authorization Time Period FOTO v6, v10    PT Start Time 1525    PT Stop Time 1610    PT Time Calculation (min) 45 min    Activity Tolerance Patient tolerated treatment well    Behavior During Therapy WFL for tasks assessed/performed             Past Medical History:  Diagnosis Date   Anxiety    during the night- wakes up with hot flashes because of panic attacks    Arthritis    degeneration of spine    Heart murmur    minimal- - pt. told that it was detected on his exit - [facility]- from [employer] service    High cholesterol    History of Doppler ultrasound    recent doppler studies of lower extremities due to pain.  Pt. told that the results were normal.    Hypertension    Neuromuscular disorder (HCC)    neuropathy, tremors in R hand at times     Past Surgical History:  Procedure Laterality Date   BACK SURGERY     x2 - lumbar surgeries, cervical fusion x1   EYE SURGERY     L cataract /w IOL   HERNIA REPAIR     R side- inguinal    KNEE ARTHROSCOPY Left    LUMBAR LAMINECTOMY/DECOMPRESSION MICRODISCECTOMY N/A 07/30/2014   Procedure: LUMBAR LAMINECTOMY/DECOMPRESSION MICRODISCECTOMY. LUMBAR TWO/THREE, THREE/FOUR, FOUR/FIVE, FIVE/SACRAL ONE;  Surgeon: Tressie Stalker, MD;  Location: [facility];  Service: Neurosurgery;  Laterality: N/A;   Patient Active Problem List   Diagnosis Date Noted   Lumbar stenosis with neurogenic claudication 07/30/2014    [facility]  REFERRING PROVIDER: Griffin Dakin, MD  REFERRING DIAG: s/p right rotator cuff repair   THERAPY DIAG:  Chronic right shoulder pain  Muscle weakness (generalized)  Rationale for Evaluation and  Treatment: Rehabilitation  ONSET DATE: 06/13/2022  SUBJECTIVE:                                                                                                                                                                                      SUBJECTIVE STATEMENT: Pt reports primary c/o Rt shoulder pain 6 weeks 1 day s/p Rt RTC repair and biceps tenodesis on 06/13/2022 with Dr. Earna Coder III.  He has not had formal PT since his surgery, and he also reports non-adherence with post-op exercise handout. Pt rates his current pain as 7/10. Worst pain is 9/10. Best pain is 5/10. Aggravating factors include reaching overhead. Easing factors include rest, wearing sling.   PERTINENT HISTORY: HTN, heart murmur, anxiety, hx of neuropathy in Rt hand, hx of two lumbar surgeries and one cervical fusion, Rotator cuff repair with biceps tenodesis on 06/13/2022  PAIN:  Are you having pain? Yes: NPRS scale: 7/10 Pain location: Rt shoulder Pain description: sharp, pins and needles Aggravating factors: Reaching overhead Relieving factors: rest, wearing sling  PRECAUTIONS: Shoulder  WEIGHT BEARING RESTRICTIONS: No  FALLS:  Has patient fallen in last 6 months? 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 PROM 160/165  Shoulder abduction 75p! PROM 180/185  Shoulder internal rotation 40p! PROM 90/95  Shoulder external rotation 55p! PROM 80/85  (Blank rows = not tested)  UPPER EXTREMITY MMT:  MMT Right eval Left eval  Shoulder flexion NT 5/5  Shoulder extension NT 5/5  Shoulder abduction NT 5/5  Shoulder internal rotation NT 5/5  Shoulder external rotation NT 5/5  Middle trapezius NT 4/5  Lower trapezius NT 3+/5  Latissimus dorsi NT 5/5  Elbow flexion 5/5 5/5  Elbow extension 5/5 5/5  Grip strength (lbs) 88 86  (Blank rows = not tested)   JOINT MOBILITY TESTING:  Deferred due to post-op status  PALPATION:  No TTP about global Rt shoulder   TODAY'S TREATMENT:                                                                                                                                          [facility] Adult PT Treatment:                                                DATE: 07/26/2022 Therapeutic Exercise: Demonstrated and issued HEP with pt education on accessing HEP through [place] Pt education regarding POC, prognosis, and FOTO Manual Therapy: N/A Neuromuscular re-ed: N/A  Therapeutic Activity: N/A Modalities: N/A Self Care: N/A    PATIENT EDUCATION: Education details: Pt educated on prognosis, POC, FOTO, and HEP Person educated: Patient Education method: Programmer, multimedia, Facilities manager, and Handouts Education comprehension: verbalized understanding and returned demonstration  HOME EXERCISE PROGRAM: Access Code: [email or web address] URL: [email or web address] Date: 07/26/2022 Prepared by: Carmelina Dane  Exercises - Shoulder Scaption AAROM with Dowel  - 1 x daily - 7 x weekly - 3 sets - 10 reps - 5 seconds hold - Standing Shoulder External Rotation AAROM with Dowel (Mirrored)  - 1 x daily - 7 x weekly - 3 sets - 10 reps - 5 seconds hold - Seated Scapular Retraction  - 1 x daily - 7 x weekly - 3 sets - 10 reps - 5 seconds  hold  ASSESSMENT:  CLINICAL IMPRESSION: Patient is a 76 y.o. M who was seen today for physical therapy evaluation and treatment for Rt shoulder pain s/p Rt RTC repair and biceps tenodesis on 06/13/2022. Upon assessment, his primary impairments include globally limited and weak Rt shoulder movements, weak parascapular strength, and limited functional ability. He will continue to benefit from skilled PT to address his primary impairments and return to his prior level of function with less limitation. In lieu of pt-provided protocol, Dr. Otis Dials RTC repair post-op protocol will be utilized during this POC.   OBJECTIVE IMPAIRMENTS: decreased endurance, decreased knowledge of condition, decreased mobility, decreased ROM, decreased strength, hypomobility, impaired flexibility, impaired UE functional use, improper body mechanics, postural dysfunction, and pain.   ACTIVITY LIMITATIONS: carrying, lifting, transfers, bed mobility, bathing, dressing, and reach over head  PARTICIPATION LIMITATIONS: meal prep, cleaning, laundry, driving, shopping, community activity, and yard work  PERSONAL FACTORS: 3+ comorbidities: See medical hx  are also affecting patient's functional outcome.   REHAB POTENTIAL: Good  CLINICAL DECISION MAKING: Stable/uncomplicated  EVALUATION COMPLEXITY: Low   GOALS: Goals reviewed with patient? Yes  SHORT TERM GOALS: Target date: 08/23/2022   Pt will report understanding and adherence to initial HEP in order to promote independence in the management of primary impairments. Baseline: HEP provided at eval Goal status: INITIAL    LONG TERM GOALS: Target date: 09/20/2022    Pt will achieve a FOTO score of 67% in order to demonstrate improved functional ability as it relates to the pt's primary impairments. Baseline: 51% Goal status: INITIAL  2.  Pt will achieve Rt shoulder flexion and abduction AROM of 140 degrees in order to reach into overhead cabinets with less  limitation. Baseline: See AROM chart Goal status: INITIAL  3.  Pt will achieve Rt shoulder ER and IR AROM of 60 degrees in order to get dressed with less limitation. Baseline: See AROM chart Goal status: INITIAL  4.  Pt will achieve global Rt shoulder MMT of 4/5 in order to return to working on his car with less limitation. Baseline: Not tested due to post-op status Goal status: INITIAL  5.  Pt will achieve 10 push-ups with 0-3/10 pain in order to return to his pre-surgery workout level with less limitation. Baseline: Unable due to post-op status Goal status: INITIAL   PLAN:  PT FREQUENCY: 2x/week  PT DURATION: 8 weeks  PLANNED INTERVENTIONS: Therapeutic exercises, Therapeutic activity, Neuromuscular re-education, Patient/Family education, Self Care, Joint mobilization, Dry Needling, Electrical stimulation, Spinal manipulation, Spinal mobilization, Cryotherapy, Moist heat, Taping, Vasopneumatic device, Biofeedback, Ionotophoresis 4mg /ml Dexamethasone, Manual therapy, and Re-evaluation  PLAN FOR NEXT SESSION: Progress early ROM, progress to strengthening in accordance with Dr. RTC repair protocol  in lieu of pt-provided protocol    Carmelina Dane, PT, DPT 07/26/22 4:30 PM

## 2022-08-02 ENCOUNTER — Ambulatory Visit: Payer: No Typology Code available for payment source

## 2022-08-02 DIAGNOSIS — M6281 Muscle weakness (generalized): Secondary | ICD-10-CM

## 2022-08-02 DIAGNOSIS — G8929 Other chronic pain: Secondary | ICD-10-CM

## 2022-08-02 DIAGNOSIS — M25511 Pain in right shoulder: Secondary | ICD-10-CM | POA: Diagnosis not present

## 2022-08-02 NOTE — Therapy (Signed)
OUTPATIENT PHYSICAL THERAPY TREATMENT NOTE   Patient Name: William SaucerHoby R Vondra MRN: [id number] DOB:Jan 16, 1946, 76 y.o., male Today's Date: 08/02/2022  PCP: [facility]  REFERRING PROVIDER: Griffin DakinMoore, Ralph Erskine III, MD   END OF SESSION:   PT End of Session - 08/02/22 1127     Visit Number 2    Number of Visits 17    Date for PT Re-Evaluation 09/27/22    Authorization Type [facility]    Authorization Time Period FOTO v6, v10    PT Start Time 1130    PT Stop Time 1220   10 minutes vasopneumatic treatment   PT Time Calculation (min) 50 min    Activity Tolerance Patient tolerated treatment well    Behavior During Therapy WFL for tasks assessed/performed             Past Medical History:  Diagnosis Date   Anxiety    during the night- wakes up with hot flashes because of panic attacks    Arthritis    degeneration of spine    Heart murmur    minimal- - pt. told that it was detected on his exit - [facility] [employer] [facility]    High cholesterol    History of Doppler ultrasound    recent doppler studies of lower extremities due to pain.  Pt. told that the results were normal.    Hypertension    Neuromuscular disorder (HCC)    neuropathy, tremors in R hand at times     Past Surgical History:  Procedure Laterality Date   BACK SURGERY     x2 - lumbar surgeries, cervical fusion x1   EYE SURGERY     L cataract /w IOL   HERNIA REPAIR     R side- inguinal    KNEE ARTHROSCOPY Left    LUMBAR LAMINECTOMY/DECOMPRESSION MICRODISCECTOMY N/A 07/30/2014   Procedure: LUMBAR LAMINECTOMY/DECOMPRESSION MICRODISCECTOMY. LUMBAR TWO/THREE, THREE/FOUR, FOUR/FIVE, FIVE/SACRAL ONE;  Surgeon: Tressie StalkerJeffrey Jenkins, MD;  Location: [facility];  Service: Neurosurgery;  Laterality: N/A;   Patient Active Problem List   Diagnosis Date Noted   Lumbar stenosis with neurogenic claudication 07/30/2014    REFERRING DIAG: s/p right rotator cuff repair   THERAPY DIAG:  Chronic right shoulder pain  Muscle  weakness (generalized)  Rationale for Evaluation and Treatment Rehabilitation  PERTINENT HISTORY: HTN, heart murmur, anxiety, hx of neuropathy in Rt hand, hx of two lumbar surgeries and one cervical fusion, Rotator cuff repair with biceps tenodesis on 06/13/2022   PRECAUTIONS: Shoulder   SUBJECTIVE:  SUBJECTIVE STATEMENT:  Pt reports feeling well today. He has been adherent to his HEP   PAIN:  Are you having pain? Yes: NPRS scale: 5-6/10 Pain location: Rt shoulder Pain description: sharp, pins and needles Aggravating factors: Reaching overhead Relieving factors: rest, wearing sling   OBJECTIVE: (objective measures completed at initial evaluation unless otherwise dated)   DIAGNOSTIC FINDINGS:  None recent available in EPIC   PATIENT SURVEYS:  FOTO 51%, predicted 67% in 15 visits   COGNITION: Overall cognitive status: Within functional limits for tasks assessed                                  SENSATION: Not tested   POSTURE: Forward shoulders/ head   UPPER EXTREMITY ROM:    A/PROM Right eval Left eval  Shoulder flexion 70p! PROM 160/165  Shoulder abduction 75p! PROM 180/185  Shoulder internal rotation 40p! PROM 90/95  Shoulder external rotation 55p! PROM 80/85  (Blank rows = not tested)   UPPER EXTREMITY MMT:   MMT Right eval Left eval  Shoulder flexion NT 5/5  Shoulder extension NT 5/5  Shoulder abduction NT 5/5  Shoulder internal rotation NT 5/5  Shoulder external rotation NT 5/5  Middle trapezius NT 4/5  Lower trapezius NT 3+/5  Latissimus dorsi NT 5/5  Elbow flexion 5/5 5/5  Elbow extension 5/5 5/5  Grip strength (lbs) 88 86  (Blank rows = not tested)     JOINT MOBILITY TESTING:  Deferred due to post-op status   PALPATION:  No TTP about global Rt shoulder              TODAY'S TREATMENT:                                                                                                                                    [facility] Adult PT Treatment:                                                DATE: 08/02/2022 Therapeutic Exercise: Standing Rt shoulder ER isometric into wall with 50% force 2x10 with 5-sec hold Standing Rt shoulder abduction isometric into wall with 50% force 2x10 with 5-sec hold Standing Rt shoulder flexion isometric into wall with 50% force 2x10 with 5-sec hold Standing Rt shoulder IR isometric into wall with 50% force 2x10 with 5-sec hold Standing scapular retraction and chin tuck into ball with back to wall 2x10 with 5-sec hold Standing Rt shoulder scaption AAROM with dowel 2x10 with 5-sec hold at end range Standing Rt shoulder ERAAROM with dowel 2x10 with 5-sec hold at end range Manual Therapy: N/A Neuromuscular re-ed: N/A Therapeutic Activity: N/A Modalities: 10 minutes of GameReady vasopneumatic treatment at 34d F to Rt shoulder  in sitting with no adverse response Self Care: N/A    [facility] Adult PT Treatment:                                                DATE: 07/26/2022 Therapeutic Exercise: Demonstrated and issued HEP with pt education on accessing HEP through [employer] Pt education regarding POC, prognosis, and FOTO Manual Therapy: N/A Neuromuscular re-ed: N/A Therapeutic Activity: N/A Modalities: N/A Self Care: N/A       PATIENT EDUCATION: Education details: Pt educated on prognosis, POC, FOTO, and HEP Person educated: Patient Education method: Programmer, multimedia, Facilities manager, and Handouts Education comprehension: verbalized understanding and returned demonstration   HOME EXERCISE PROGRAM: Access Code: [email or web address] URL: [email or web address] Date: 07/26/2022 Prepared by: Carmelina Dane   Exercises - Shoulder Scaption AAROM with Dowel  - 1 x daily - 7 x weekly - 3 sets - 10 reps - 5 seconds hold -  Standing Shoulder External Rotation AAROM with Dowel (Mirrored)  - 1 x daily - 7 x weekly - 3 sets - 10 reps - 5 seconds hold - Seated Scapular Retraction  - 1 x daily - 7 x weekly - 3 sets - 10 reps - 5 seconds hold   ASSESSMENT:   CLINICAL IMPRESSION: Patient presents 7 weeks and 1 day s/p Rt RTC repair and biceps tenodesis on 06/13/2022. Pt responded excellently to initiation of sub-max rotator cuff isometrics today, as well as progressed scapular strengthening and shoulder AAROM. He will continue to benefit from skilled PT to address his primary impairments and return to his prior level of function with less limitation.    OBJECTIVE IMPAIRMENTS: decreased endurance, decreased knowledge of condition, decreased mobility, decreased ROM, decreased strength, hypomobility, impaired flexibility, impaired UE functional use, improper body mechanics, postural dysfunction, and pain.    ACTIVITY LIMITATIONS: carrying, lifting, transfers, bed mobility, bathing, dressing, and reach over head   PARTICIPATION LIMITATIONS: meal prep, cleaning, laundry, driving, shopping, community activity, and yard work   PERSONAL FACTORS: 3+ comorbidities: See medical hx  are also affecting patient's functional outcome.       GOALS: Goals reviewed with patient? Yes   SHORT TERM GOALS: Target date: 08/23/2022    Pt will report understanding and adherence to initial HEP in order to promote independence in the management of primary impairments. Baseline: HEP provided at eval Goal status: INITIAL       LONG TERM GOALS: Target date: 09/20/2022     Pt will achieve a FOTO score of 67% in order to demonstrate improved functional ability as it relates to the pt's primary impairments. Baseline: 51% Goal status: INITIAL   2.  Pt will achieve Rt shoulder flexion and abduction AROM of 140 degrees in order to reach into overhead cabinets with less limitation. Baseline: See AROM chart Goal status: INITIAL   3.  Pt will  achieve Rt shoulder ER and IR AROM of 60 degrees in order to get dressed with less limitation. Baseline: See AROM chart Goal status: INITIAL   4.  Pt will achieve global Rt shoulder MMT of 4/5 in order to return to working on his car with less limitation. Baseline: Not tested due to post-op status Goal status: INITIAL   5.  Pt will achieve 10 push-ups with 0-3/10 pain in order to return to his pre-surgery workout level with less limitation. Baseline: Unable due to post-op status  Goal status: INITIAL     PLAN:   PT FREQUENCY: 2x/week   PT DURATION: 8 weeks   PLANNED INTERVENTIONS: Therapeutic exercises, Therapeutic activity, Neuromuscular re-education, Patient/Family education, Self Care, Joint mobilization, Dry Needling, Electrical stimulation, Spinal manipulation, Spinal mobilization, Cryotherapy, Moist heat, Taping, Vasopneumatic device, Biofeedback, Ionotophoresis 4mg /ml Dexamethasone, Manual therapy, and Re-evaluation   PLAN FOR NEXT SESSION: Progress early ROM, progress to strengthening in accordance with Dr. RTC repair protocol in lieu of pt-provided protocol   Otis Dials, PT, DPT 08/02/22 12:20 PM

## 2022-08-04 ENCOUNTER — Ambulatory Visit: Payer: No Typology Code available for payment source

## 2022-08-04 DIAGNOSIS — G8929 Other chronic pain: Secondary | ICD-10-CM

## 2022-08-04 DIAGNOSIS — M6281 Muscle weakness (generalized): Secondary | ICD-10-CM

## 2022-08-04 DIAGNOSIS — M25511 Pain in right shoulder: Secondary | ICD-10-CM | POA: Diagnosis not present

## 2022-08-04 NOTE — Therapy (Signed)
OUTPATIENT PHYSICAL THERAPY TREATMENT NOTE   Patient Name: William Meyer MRN: [id number] DOB:1945/10/01, 76 y.o., male Today's Date: 08/04/2022  PCP: [facility]  REFERRING PROVIDER: Griffin Dakin, MD   END OF SESSION:   PT End of Session - 08/04/22 1204     Visit Number 3    Number of Visits 17    Date for PT Re-Evaluation 09/27/22    Authorization Type [facility]    Authorization Time Period FOTO v6, v10    PT Start Time 1214    PT Stop Time 1304   10 minutes vasopneumatic treatment   PT Time Calculation (min) 50 min    Activity Tolerance Patient tolerated treatment well    Behavior During Therapy WFL for tasks assessed/performed             Past Medical History:  Diagnosis Date   Anxiety    during the night- wakes up with hot flashes because of panic attacks    Arthritis    degeneration of spine    Heart murmur    minimal- - pt. told that it was detected on his exit - [place]- from [employer] service    High cholesterol    History of Doppler ultrasound    recent doppler studies of lower extremities due to pain.  Pt. told that the results were normal.    Hypertension    Neuromuscular disorder (HCC)    neuropathy, tremors in R hand at times     Past Surgical History:  Procedure Laterality Date   BACK SURGERY     x2 - lumbar surgeries, cervical fusion x1   EYE SURGERY     L cataract /w IOL   HERNIA REPAIR     R side- inguinal    KNEE ARTHROSCOPY Left    LUMBAR LAMINECTOMY/DECOMPRESSION MICRODISCECTOMY N/A 07/30/2014   Procedure: LUMBAR LAMINECTOMY/DECOMPRESSION MICRODISCECTOMY. LUMBAR TWO/THREE, THREE/FOUR, FOUR/FIVE, FIVE/SACRAL ONE;  Surgeon: Tressie Stalker, MD;  Location: [facility];  Service: Neurosurgery;  Laterality: N/A;   Patient Active Problem List   Diagnosis Date Noted   Lumbar stenosis with neurogenic claudication 07/30/2014    REFERRING DIAG: s/p right rotator cuff repair   THERAPY DIAG:  Chronic right shoulder pain  Muscle  weakness (generalized)  Rationale for Evaluation and Treatment Rehabilitation  PERTINENT HISTORY: HTN, heart murmur, anxiety, hx of neuropathy in Rt hand, hx of two lumbar surgeries and one cervical fusion, Rotator cuff repair with biceps tenodesis on 06/13/2022   PRECAUTIONS: Shoulder   SUBJECTIVE:  SUBJECTIVE STATEMENT:  Pt reports current pain of 7/10. He reports soreness following the last treatment, although this did not prevent him from doing his HEP yesterday.    PAIN:  Are you having pain? Yes: NPRS scale: 7/10 Pain location: Rt shoulder Pain description: sharp, pins and needles Aggravating factors: Reaching overhead Relieving factors: rest, wearing sling   OBJECTIVE: (objective measures completed at initial evaluation unless otherwise dated)   DIAGNOSTIC FINDINGS:  None recent available in EPIC   PATIENT SURVEYS:  FOTO 51%, predicted 67% in 15 visits   COGNITION: Overall cognitive status: Within functional limits for tasks assessed                                  SENSATION: Not tested   POSTURE: Forward shoulders/ head   UPPER EXTREMITY ROM:    A/PROM Right eval Left eval  Shoulder flexion 70p! PROM 160/165  Shoulder abduction 75p! PROM 180/185  Shoulder internal rotation 40p! PROM 90/95  Shoulder external rotation 55p! PROM 80/85  (Blank rows = not tested)   UPPER EXTREMITY MMT:   MMT Right eval Left eval  Shoulder flexion NT 5/5  Shoulder extension NT 5/5  Shoulder abduction NT 5/5  Shoulder internal rotation NT 5/5  Shoulder external rotation NT 5/5  Middle trapezius NT 4/5  Lower trapezius NT 3+/5  Latissimus dorsi NT 5/5  Elbow flexion 5/5 5/5  Elbow extension 5/5 5/5  Grip strength (lbs) 88 86  (Blank rows = not tested)     JOINT MOBILITY TESTING:   Deferred due to post-op status   PALPATION:  No TTP about global Rt shoulder             TODAY'S TREATMENT:                                                             [facility] Adult PT Treatment:                                                DATE: 08/04/2022 Therapeutic Exercise: Seated Rt shoulder ER AAROM with dowel 2x10 with 5-sec holds with rolled towel under elbow Standing BIL shoulder flexion AAROM with black physioball at wall and PT perturbations at end range 3x30 seconds Bent-over Rt shoulder pendulums 3x30sec Standing isometric BIL shoulder extension/ scapular retraction with elbows to wall and chin tuck hold with ball at wall 2x10 with 5-sec holds Standing isometric BIL shoulder horizontal abduction/ scapular retraction with elbows to wall and chin tuck hold with ball at wall 2x10 with 5-sec holds Standing shoulder rolls 2x10 forward and backward Manual Therapy: Supine Rt shoulder flexion/ abduction PROM with gentle distraction/ vibrations for pain modulation x8 minutes Neuromuscular re-ed: N/A Therapeutic Activity: N/A Modalities: 10 minutes of GameReady vasopneumatic treatment at 34d F to Rt shoulder in sitting with no adverse response Self Care: N/A  [facility] Adult PT Treatment:                                                DATE: 08/02/2022 Therapeutic Exercise: Standing Rt shoulder ER isometric into wall with 50% force 2x10 with 5-sec hold Standing Rt shoulder abduction isometric into wall with 50% force 2x10 with 5-sec hold Standing Rt shoulder flexion isometric into wall with 50% force 2x10 with 5-sec hold Standing Rt shoulder IR isometric into wall with 50% force 2x10 with 5-sec hold Standing scapular retraction and chin tuck into ball with back to wall 2x10 with 5-sec hold Standing Rt shoulder scaption AAROM with dowel 2x10 with 5-sec hold at end range Standing Rt shoulder ERAAROM with dowel 2x10  with 5-sec hold at end range Manual Therapy: N/A Neuromuscular re-ed: N/A Therapeutic Activity: N/A Modalities: 10 minutes of GameReady vasopneumatic treatment at 34d F to Rt shoulder in sitting with no adverse response Self Care: N/A    [facility] Adult PT Treatment:                                                DATE: 07/26/2022 Therapeutic Exercise: Demonstrated and issued HEP with pt education on accessing HEP through [employer] Pt education regarding POC, prognosis, and FOTO Manual Therapy: N/A Neuromuscular re-ed: N/A Therapeutic Activity: N/A Modalities: N/A Self Care: N/A       PATIENT EDUCATION: Education details: Pt educated on prognosis, POC, FOTO, and HEP Person educated: Patient Education method: Programmer, multimedia, Facilities manager, and Handouts Education comprehension: verbalized understanding and returned demonstration   HOME EXERCISE PROGRAM: Access Code: [email or web address] URL: [email or web address] Date: 07/26/2022 Prepared by: Carmelina Dane   Exercises - Shoulder Scaption AAROM with Dowel  - 1 x daily - 7 x weekly - 3 sets - 10 reps - 5 seconds hold - Standing Shoulder External Rotation AAROM with Dowel (Mirrored)  - 1 x daily - 7 x weekly - 3 sets - 10 reps - 5 seconds hold - Seated Scapular Retraction  - 1 x daily - 7 x weekly - 3 sets - 10 reps - 5 seconds hold   ASSESSMENT:   CLINICAL IMPRESSION: Patient presents 7 weeks and 3 days s/p Rt RTC repair and biceps tenodesis on 06/13/2022. Pt continues to respond well to all interventions, demonstrating good form and no increase in pain with selected exercises. He also reports therapeutic response to vasopneumatic treatment today. He will continue to benefit from skilled PT to address his primary impairments and return to his prior level of function with less limitation.   OBJECTIVE IMPAIRMENTS: decreased endurance, decreased knowledge of condition, decreased mobility, decreased ROM, decreased strength,  hypomobility, impaired flexibility, impaired UE functional use, improper body mechanics, postural dysfunction, and pain.    ACTIVITY LIMITATIONS: carrying, lifting, transfers, bed mobility, bathing, dressing, and reach over head   PARTICIPATION LIMITATIONS: meal prep, cleaning, laundry, driving, shopping, community activity, and yard work   PERSONAL FACTORS: 3+ comorbidities: See medical hx  are also affecting patient's functional outcome.       GOALS: Goals reviewed with patient? Yes   SHORT TERM GOALS: Target date: 08/23/2022    Pt will report understanding and adherence to initial HEP in order to promote independence in the management of  primary impairments. Baseline: HEP provided at eval Goal status: INITIAL       LONG TERM GOALS: Target date: 09/20/2022     Pt will achieve a FOTO score of 67% in order to demonstrate improved functional ability as it relates to the pt's primary impairments. Baseline: 51% Goal status: INITIAL   2.  Pt will achieve Rt shoulder flexion and abduction AROM of 140 degrees in order to reach into overhead cabinets with less limitation. Baseline: See AROM chart Goal status: INITIAL   3.  Pt will achieve Rt shoulder ER and IR AROM of 60 degrees in order to get dressed with less limitation. Baseline: See AROM chart Goal status: INITIAL   4.  Pt will achieve global Rt shoulder MMT of 4/5 in order to return to working on his car with less limitation. Baseline: Not tested due to post-op status Goal status: INITIAL   5.  Pt will achieve 10 push-ups with 0-3/10 pain in order to return to his pre-surgery workout level with less limitation. Baseline: Unable due to post-op status Goal status: INITIAL     PLAN:   PT FREQUENCY: 2x/week   PT DURATION: 8 weeks   PLANNED INTERVENTIONS: Therapeutic exercises, Therapeutic activity, Neuromuscular re-education, Patient/Family education, Self Care, Joint mobilization, Dry Needling, Electrical stimulation,  Spinal manipulation, Spinal mobilization, Cryotherapy, Moist heat, Taping, Vasopneumatic device, Biofeedback, Ionotophoresis 4mg /ml Dexamethasone, Manual therapy, and Re-evaluation   PLAN FOR NEXT SESSION: Progress early ROM, progress to strengthening in accordance with Dr. Otis Dialsax Varkey's RTC repair protocol in lieu of pt-provided protocol   Carmelina DaneYarborough, Koron Godeaux, PT, DPT 08/04/22 1:25 PM

## 2022-08-16 ENCOUNTER — Encounter: Payer: Self-pay | Admitting: Physical Therapy

## 2022-08-16 ENCOUNTER — Ambulatory Visit: Payer: No Typology Code available for payment source | Admitting: Physical Therapy

## 2022-08-16 DIAGNOSIS — M6281 Muscle weakness (generalized): Secondary | ICD-10-CM

## 2022-08-16 DIAGNOSIS — G8929 Other chronic pain: Secondary | ICD-10-CM

## 2022-08-16 DIAGNOSIS — M25511 Pain in right shoulder: Secondary | ICD-10-CM | POA: Diagnosis not present

## 2022-08-16 NOTE — Therapy (Signed)
OUTPATIENT PHYSICAL THERAPY TREATMENT NOTE   Patient Name: William Meyer MRN: [id number] DOB:Oct 21, 1945, 76 y.o., male Today's Date: 08/16/2022  PCP: [facility]  REFERRING PROVIDER: Griffin Dakin, MD   END OF SESSION:   PT End of Session - 08/16/22 1215     Visit Number 4    Number of Visits 17    Date for PT Re-Evaluation 09/27/22    Authorization Type [facility]    Authorization Time Period FOTO v6, v10    PT Start Time 1215    PT Stop Time 1256    PT Time Calculation (min) 41 min    Activity Tolerance Patient tolerated treatment well    Behavior During Therapy WFL for tasks assessed/performed             Past Medical History:  Diagnosis Date   Anxiety    during the night- wakes up with hot flashes because of panic attacks    Arthritis    degeneration of spine    Heart murmur    minimal- - pt. told that it was detected on his exit - [place]- from [employer] service    High cholesterol    History of Doppler ultrasound    recent doppler studies of lower extremities due to pain.  Pt. told that the results were normal.    Hypertension    Neuromuscular disorder (HCC)    neuropathy, tremors in R hand at times     Past Surgical History:  Procedure Laterality Date   BACK SURGERY     x2 - lumbar surgeries, cervical fusion x1   EYE SURGERY     L cataract /w IOL   HERNIA REPAIR     R side- inguinal    KNEE ARTHROSCOPY Left    LUMBAR LAMINECTOMY/DECOMPRESSION MICRODISCECTOMY N/A 07/30/2014   Procedure: LUMBAR LAMINECTOMY/DECOMPRESSION MICRODISCECTOMY. LUMBAR TWO/THREE, THREE/FOUR, FOUR/FIVE, FIVE/SACRAL ONE;  Surgeon: Tressie Stalker, MD;  Location: [facility];  Service: Neurosurgery;  Laterality: N/A;   Patient Active Problem List   Diagnosis Date Noted   Lumbar stenosis with neurogenic claudication 07/30/2014    REFERRING DIAG: s/p right rotator cuff repair   THERAPY DIAG:  Chronic right shoulder pain  Muscle weakness (generalized)  Rationale for  Evaluation and Treatment Rehabilitation  PERTINENT HISTORY: HTN, heart murmur, anxiety, hx of neuropathy in Rt hand, hx of two lumbar surgeries and one cervical fusion, Rotator cuff repair with biceps tenodesis on 06/13/2022   PRECAUTIONS: Shoulder   SUBJECTIVE:  SUBJECTIVE STATEMENT:  Pt reports that he was putting up Christmas decorations over the weekend.  He thinks this may have caused a slight increase in his pain.   PAIN:  Are you having pain? Yes: NPRS scale: 8/10 Pain location: Rt shoulder Pain description: sharp, pins and needles Aggravating factors: Reaching overhead Relieving factors: rest, wearing sling   OBJECTIVE: (objective measures completed at initial evaluation unless otherwise dated)   DIAGNOSTIC FINDINGS:  None recent available in EPIC   PATIENT SURVEYS:  FOTO 51%, predicted 67% in 15 visits   COGNITION: Overall cognitive status: Within functional limits for tasks assessed                                  SENSATION: Not tested   POSTURE: Forward shoulders/ head   UPPER EXTREMITY ROM:    A/PROM Right eval Left eval R 11/28  Shoulder flexion 70p! PROM 160/165 130 P!  Shoulder abduction 75p! PROM 180/185   Shoulder internal rotation 40p! PROM 90/95   Shoulder external rotation 55p! PROM 80/85   (Blank rows = not tested)   UPPER EXTREMITY MMT:   MMT Right eval Left eval  Shoulder flexion NT 5/5  Shoulder extension NT 5/5  Shoulder abduction NT 5/5  Shoulder internal rotation NT 5/5  Shoulder external rotation NT 5/5  Middle trapezius NT 4/5  Lower trapezius NT 3+/5  Latissimus dorsi NT 5/5  Elbow flexion 5/5 5/5  Elbow extension 5/5 5/5  Grip strength (lbs) 88 86  (Blank rows = not tested)     JOINT MOBILITY TESTING:  Deferred due to post-op status    PALPATION:  No TTP about global Rt shoulder             TODAY'S TREATMENT:    [facility] Adult PT Treatment:                                                DATE: 08/16/2022 Therapeutic Exercise: Pball chest press in supine - 3x10 Pball flexion in supine - 3x10 Rhythmic stabilization 90 degrees of flexion x2 S/L ER in R S/L - 3x15 UE ranger - 34'' - 3x15  Manual Therapy: Supine Rt shoulder flexion/ER/abduction PROM with gentle distraction/ vibrations for pain modulation  Modalities: 10 minutes of GameReady vasopneumatic treatment at 34d F to Rt shoulder in sitting with no adverse response                                                              [facility] Adult PT Treatment:                                                DATE: 08/04/2022 Therapeutic Exercise: Seated Rt shoulder ER AAROM with dowel 2x10 with 5-sec holds with rolled towel under elbow Standing BIL shoulder flexion AAROM with black physioball at wall and PT perturbations at end range 3x30 seconds Bent-over Rt shoulder pendulums 3x30sec Standing isometric BIL shoulder extension/ scapular  retraction with elbows to wall and chin tuck hold with ball at wall 2x10 with 5-sec holds Standing isometric BIL shoulder horizontal abduction/ scapular retraction with elbows to wall and chin tuck hold with ball at wall 2x10 with 5-sec holds Standing shoulder rolls 2x10 forward and backward Manual Therapy: Supine Rt shoulder flexion/ abduction PROM with gentle distraction/ vibrations for pain modulation x8 minutes Neuromuscular re-ed: N/A Therapeutic Activity: N/A Modalities: 10 minutes of GameReady vasopneumatic treatment at 34d F to Rt shoulder in sitting with no adverse response Self Care: N/A                                                                          [facility]                                                DATE: 08/02/2022 Therapeutic Exercise: Standing Rt shoulder ER isometric into wall with 50% force  2x10 with 5-sec hold Standing Rt shoulder abduction isometric into wall with 50% force 2x10 with 5-sec hold Standing Rt shoulder flexion isometric into wall with 50% force 2x10 with 5-sec hold Standing Rt shoulder IR isometric into wall with 50% force 2x10 with 5-sec hold Standing scapular retraction and chin tuck into ball with back to wall 2x10 with 5-sec hold Standing Rt shoulder scaption AAROM with dowel 2x10 with 5-sec hold at end range Standing Rt shoulder ERAAROM with dowel 2x10 with 5-sec hold at end range Manual Therapy: N/A Neuromuscular re-ed: N/A Therapeutic Activity: N/A Modalities: 10 minutes of GameReady vasopneumatic treatment at 34d F to Rt shoulder in sitting with no adverse response Self Care: N/A    [facility]                                                DATE: 07/26/2022 Therapeutic Exercise: Demonstrated and issued HEP with pt education on accessing HEP through [employer] Pt education regarding POC, prognosis, and FOTO Manual Therapy: N/A Neuromuscular re-ed: N/A Therapeutic Activity: N/A Modalities: N/A Self Care: N/A       PATIENT EDUCATION: Education details: Pt educated on prognosis, POC, FOTO, and HEP Person educated: Patient Education method: Programmer, multimediaxplanation, Facilities managerDemonstration, and Handouts Education comprehension: verbalized understanding and returned demonstration   HOME EXERCISE PROGRAM: Access Code: [email or web address] URL: [email or web address] Date: 08/16/2022 Prepared by: Alphonzo SeveranceKarl Kevin Mario  Exercises - Shoulder Scaption AAROM with Dowel  - 1 x daily - 7 x weekly - 3 sets - 10 reps - 5 seconds hold - Standing Shoulder External Rotation AAROM with Dowel (Mirrored)  - 1 x daily - 7 x weekly - 3 sets - 10 reps - 5 seconds hold - Seated Scapular Retraction  - 1 x daily - 7 x weekly - 3 sets - 10 reps - 5 seconds hold - Sidelying Shoulder External Rotation  - 1 x daily - 7 x weekly - 3 sets - 10 reps   ASSESSMENT:   CLINICAL  IMPRESSION: William Meyer  tolerated session well with no adverse reaction.  He has high levels of baseline pain which is not increased with therex today.  He shows significant improvement in flexion PROM compared to eval.  Continue per POC.   OBJECTIVE IMPAIRMENTS: decreased endurance, decreased knowledge of condition, decreased mobility, decreased ROM, decreased strength, hypomobility, impaired flexibility, impaired UE functional use, improper body mechanics, postural dysfunction, and pain.    ACTIVITY LIMITATIONS: carrying, lifting, transfers, bed mobility, bathing, dressing, and reach over head   PARTICIPATION LIMITATIONS: meal prep, cleaning, laundry, driving, shopping, community activity, and yard work   PERSONAL FACTORS: 3+ comorbidities: See medical hx  are also affecting patient's functional outcome.       GOALS: Goals reviewed with patient? Yes   SHORT TERM GOALS: Target date: 08/23/2022    Pt will report understanding and adherence to initial HEP in order to promote independence in the management of primary impairments. Baseline: HEP provided at eval Goal status: INITIAL       LONG TERM GOALS: Target date: 09/20/2022     Pt will achieve a FOTO score of 67% in order to demonstrate improved functional ability as it relates to the pt's primary impairments. Baseline: 51% Goal status: INITIAL   2.  Pt will achieve Rt shoulder flexion and abduction AROM of 140 degrees in order to reach into overhead cabinets with less limitation. Baseline: See AROM chart Goal status: INITIAL   3.  Pt will achieve Rt shoulder ER and IR AROM of 60 degrees in order to get dressed with less limitation. Baseline: See AROM chart Goal status: INITIAL   4.  Pt will achieve global Rt shoulder MMT of 4/5 in order to return to working on his car with less limitation. Baseline: Not tested due to post-op status Goal status: INITIAL   5.  Pt will achieve 10 push-ups with 0-3/10 pain in order to return to his  pre-surgery workout level with less limitation. Baseline: Unable due to post-op status Goal status: INITIAL     PLAN:   PT FREQUENCY: 2x/week   PT DURATION: 8 weeks   PLANNED INTERVENTIONS: Therapeutic exercises, Therapeutic activity, Neuromuscular re-education, Patient/Family education, Self Care, Joint mobilization, Dry Needling, Electrical stimulation, Spinal manipulation, Spinal mobilization, Cryotherapy, Moist heat, Taping, Vasopneumatic device, Biofeedback, Ionotophoresis 4mg /ml Dexamethasone, Manual therapy, and Re-evaluation   PLAN FOR NEXT SESSION: Progress early ROM, progress to strengthening in accordance with Dr. RTC repair protocol in lieu of pt-provided protocol   Otis Dials Denesha Brouse PT 08/16/22 1:06 PM

## 2022-08-18 ENCOUNTER — Ambulatory Visit: Payer: No Typology Code available for payment source

## 2022-08-18 DIAGNOSIS — G8929 Other chronic pain: Secondary | ICD-10-CM

## 2022-08-18 DIAGNOSIS — M25511 Pain in right shoulder: Secondary | ICD-10-CM | POA: Diagnosis not present

## 2022-08-18 DIAGNOSIS — M6281 Muscle weakness (generalized): Secondary | ICD-10-CM

## 2022-08-18 NOTE — Therapy (Signed)
OUTPATIENT PHYSICAL THERAPY TREATMENT NOTE   Patient Name: William Meyer MRN: [id number] DOB:Feb 03, 1946, 76 y.o., male Today's Date: 08/18/2022  PCP: [facility]  REFERRING PROVIDER: Griffin Dakin, MD   END OF SESSION:   PT End of Session - 08/18/22 1129     Visit Number 5    Number of Visits 17    Date for PT Re-Evaluation 09/27/22    Authorization Type [facility]    Authorization Time Period FOTO v6, v10    PT Start Time 1130    PT Stop Time 1210    PT Time Calculation (min) 40 min    Activity Tolerance Patient tolerated treatment well    Behavior During Therapy WFL for tasks assessed/performed              Past Medical History:  Diagnosis Date   Anxiety    during the night- wakes up with hot flashes because of panic attacks    Arthritis    degeneration of spine    Heart murmur    minimal- - pt. told that it was detected on his exit - [facility]- from [employer] service    High cholesterol    History of Doppler ultrasound    recent doppler studies of lower extremities due to pain.  Pt. told that the results were normal.    Hypertension    Neuromuscular disorder (HCC)    neuropathy, tremors in R hand at times     Past Surgical History:  Procedure Laterality Date   BACK SURGERY     x2 - lumbar surgeries, cervical fusion x1   EYE SURGERY     L cataract /w IOL   HERNIA REPAIR     R side- inguinal    KNEE ARTHROSCOPY Left    LUMBAR LAMINECTOMY/DECOMPRESSION MICRODISCECTOMY N/A 07/30/2014   Procedure: LUMBAR LAMINECTOMY/DECOMPRESSION MICRODISCECTOMY. LUMBAR TWO/THREE, THREE/FOUR, FOUR/FIVE, FIVE/SACRAL ONE;  Surgeon: Tressie Stalker, MD;  Location: [facility];  Service: Neurosurgery;  Laterality: N/A;   Patient Active Problem List   Diagnosis Date Noted   Lumbar stenosis with neurogenic claudication 07/30/2014    REFERRING DIAG: s/p right rotator cuff repair   THERAPY DIAG:  Chronic right shoulder pain  Muscle weakness (generalized)  Rationale for  Evaluation and Treatment Rehabilitation  PERTINENT HISTORY: HTN, heart murmur, anxiety, hx of neuropathy in Rt hand, hx of two lumbar surgeries and one cervical fusion, Rotator cuff repair with biceps tenodesis on 06/13/2022   PRECAUTIONS: Shoulder   SUBJECTIVE:  SUBJECTIVE STATEMENT:  Patient reports continued high levels of shoulder pain.    PAIN:  Are you having pain? Yes: NPRS scale: 7/10 Pain location: Rt shoulder Pain description: sharp, pins and needles Aggravating factors: Reaching overhead Relieving factors: rest, wearing sling   OBJECTIVE: (objective measures completed at initial evaluation unless otherwise dated)   DIAGNOSTIC FINDINGS:  None recent available in EPIC   PATIENT SURVEYS:  FOTO 51%, predicted 67% in 15 visits   COGNITION: Overall cognitive status: Within functional limits for tasks assessed                                  SENSATION: Not tested   POSTURE: Forward shoulders/ head   UPPER EXTREMITY ROM:    A/PROM Right eval Left eval R 11/28  Shoulder flexion 70p! PROM 160/165 130 P!  Shoulder abduction 75p! PROM 180/185   Shoulder internal rotation 40p! PROM 90/95   Shoulder external rotation 55p! PROM 80/85   (Blank rows = not tested)   UPPER EXTREMITY MMT:   MMT Right eval Left eval  Shoulder flexion NT 5/5  Shoulder extension NT 5/5  Shoulder abduction NT 5/5  Shoulder internal rotation NT 5/5  Shoulder external rotation NT 5/5  Middle trapezius NT 4/5  Lower trapezius NT 3+/5  Latissimus dorsi NT 5/5  Elbow flexion 5/5 5/5  Elbow extension 5/5 5/5  Grip strength (lbs) 88 86  (Blank rows = not tested)     JOINT MOBILITY TESTING:  Deferred due to post-op status   PALPATION:  No TTP about global Rt shoulder             TODAY'S TREATMENT:    [facility] Adult PT Treatment:                                                DATE: 08/18/2022 Therapeutic Exercise: Pulleys flexion/scaption x2' Pball chest press in supine - 3x10 Pball flexion in supine - 3x10 Rhythmic stabilization 90 degrees of flexion x60" S/L ER in R S/L - 2x10 with tennis ball Flexion AROM with tennis ball x15 UE ranger - 34'' - 3x15 Manual Therapy: Supine Rt shoulder flexion/ER/abduction PROM with gentle distraction/ vibrations for pain modulation Modalities: 10 minutes of GameReady vasopneumatic treatment at 34d F to Rt shoulder in sitting with no adverse response  [facility] Adult PT Treatment:                                                DATE: 08/16/2022 Therapeutic Exercise: Pball chest press in supine - 3x10 Pball flexion in supine - 3x10 Rhythmic stabilization 90 degrees of flexion x2 S/L ER in R S/L - 3x15 UE ranger - 34'' - 3x15  Manual Therapy: Supine Rt shoulder flexion/ER/abduction PROM with gentle distraction/ vibrations for pain modulation  Modalities: 10 minutes of GameReady vasopneumatic treatment at 34d F to Rt shoulder in sitting with no adverse response  [facility] Adult PT Treatment:                                                DATE: 08/04/2022 Therapeutic Exercise: Seated Rt shoulder ER AAROM with dowel 2x10 with 5-sec holds with rolled towel under elbow Standing BIL shoulder flexion AAROM with black physioball at wall and PT perturbations at end range 3x30 seconds Bent-over Rt shoulder pendulums 3x30sec Standing isometric BIL shoulder extension/ scapular retraction with elbows to wall and chin tuck hold with ball at wall 2x10 with 5-sec holds Standing isometric BIL shoulder horizontal abduction/ scapular retraction with elbows to wall and chin tuck hold with ball at wall 2x10 with 5-sec holds Standing shoulder rolls 2x10 forward and backward Manual Therapy: Supine Rt shoulder flexion/  abduction PROM with gentle distraction/ vibrations for pain modulation x8 minutes Neuromuscular re-ed: N/A Therapeutic Activity: N/A Modalities: 10 minutes of GameReady vasopneumatic treatment at 34d F to Rt shoulder in sitting with no adverse response Self Care: N/A                                                                            PATIENT EDUCATION: Education details: Pt educated on prognosis, POC, FOTO, and HEP Person educated: Patient Education method: Programmer, multimediaxplanation, Facilities managerDemonstration, and Handouts Education comprehension: verbalized understanding and returned demonstration   HOME EXERCISE PROGRAM: Access Code: [email or web address] URL: [email or web address] Date: 08/16/2022 Prepared by: Alphonzo SeveranceKarl Reinhartsen  Exercises - Shoulder Scaption AAROM with Dowel  - 1 x daily - 7 x weekly - 3 sets - 10 reps - 5 seconds hold - Standing Shoulder External Rotation AAROM with Dowel (Mirrored)  - 1 x daily - 7 x weekly - 3 sets - 10 reps - 5 seconds hold - Seated Scapular Retraction  - 1 x daily - 7 x weekly - 3 sets - 10 reps - 5 seconds hold - Sidelying Shoulder External Rotation  - 1 x daily - 7 x weekly - 3 sets - 10 reps   ASSESSMENT:   CLINICAL IMPRESSION: Patient presents to PT with continued high levels of baseline pain and reports HEP compliance. Session today continued to focus on progressing AAROM to AROM within patients pain tolerance. Patient was able to tolerate all prescribed exercises with no adverse effects. Patient continues to benefit from skilled PT services and should be progressed as able to improve functional independence.     OBJECTIVE IMPAIRMENTS: decreased endurance, decreased knowledge of condition, decreased mobility, decreased ROM, decreased strength, hypomobility, impaired flexibility, impaired UE functional use, improper body mechanics, postural dysfunction, and pain.    ACTIVITY LIMITATIONS: carrying, lifting, transfers, bed mobility, bathing, dressing,  and reach over head   PARTICIPATION LIMITATIONS: meal prep, cleaning, laundry, driving, shopping, community activity, and yard work   PERSONAL FACTORS: 3+ comorbidities: See medical hx  are also affecting patient's functional outcome.       GOALS: Goals reviewed with patient? Yes   SHORT TERM GOALS: Target date: 08/23/2022    Pt will report understanding and adherence to initial HEP in order to promote independence in the management of primary impairments.  Baseline: HEP provided at eval Goal status: INITIAL       LONG TERM GOALS: Target date: 09/20/2022     Pt will achieve a FOTO score of 67% in order to demonstrate improved functional ability as it relates to the pt's primary impairments. Baseline: 51% Goal status: INITIAL   2.  Pt will achieve Rt shoulder flexion and abduction AROM of 140 degrees in order to reach into overhead cabinets with less limitation. Baseline: See AROM chart Goal status: INITIAL   3.  Pt will achieve Rt shoulder ER and IR AROM of 60 degrees in order to get dressed with less limitation. Baseline: See AROM chart Goal status: INITIAL   4.  Pt will achieve global Rt shoulder MMT of 4/5 in order to return to working on his car with less limitation. Baseline: Not tested due to post-op status Goal status: INITIAL   5.  Pt will achieve 10 push-ups with 0-3/10 pain in order to return to his pre-surgery workout level with less limitation. Baseline: Unable due to post-op status Goal status: INITIAL     PLAN:   PT FREQUENCY: 2x/week   PT DURATION: 8 weeks   PLANNED INTERVENTIONS: Therapeutic exercises, Therapeutic activity, Neuromuscular re-education, Patient/Family education, Self Care, Joint mobilization, Dry Needling, Electrical stimulation, Spinal manipulation, Spinal mobilization, Cryotherapy, Moist heat, Taping, Vasopneumatic device, Biofeedback, Ionotophoresis 4mg /ml Dexamethasone, Manual therapy, and Re-evaluation   PLAN FOR NEXT SESSION: Progress  early ROM, progress to strengthening in accordance with Dr. RTC repair protocol in lieu of pt-provided protocol   Otis Dials PTA 08/18/22 11:32 AM

## 2022-08-23 ENCOUNTER — Ambulatory Visit: Payer: No Typology Code available for payment source | Attending: Orthopedic Surgery

## 2022-08-23 DIAGNOSIS — G8929 Other chronic pain: Secondary | ICD-10-CM | POA: Insufficient documentation

## 2022-08-23 DIAGNOSIS — M6281 Muscle weakness (generalized): Secondary | ICD-10-CM | POA: Diagnosis present

## 2022-08-23 DIAGNOSIS — Z9889 Other specified postprocedural states: Secondary | ICD-10-CM | POA: Insufficient documentation

## 2022-08-23 DIAGNOSIS — M25511 Pain in right shoulder: Secondary | ICD-10-CM | POA: Diagnosis not present

## 2022-08-23 NOTE — Therapy (Signed)
OUTPATIENT PHYSICAL THERAPY TREATMENT NOTE   Patient Name: William Meyer MRN: [id number] DOB:1946/08/10, 76 y.o., male Today's Date: 08/23/2022  PCP: [facility]  REFERRING PROVIDER: Gareth Morgan, MD   END OF SESSION:   PT End of Session - 08/23/22 1119     Visit Number 6    Number of Visits 17    Date for PT Re-Evaluation 09/27/22    Authorization Type [facility]    Authorization Time Period FOTO v6, v10    PT Start Time 1120    PT Stop Time 1208    PT Time Calculation (min) 48 min    Activity Tolerance Patient tolerated treatment well    Behavior During Therapy WFL for tasks assessed/performed               Past Medical History:  Diagnosis Date   Anxiety    during the night- wakes up with hot flashes because of panic attacks    Arthritis    degeneration of spine    Heart murmur    minimal- - pt. told that it was detected on his exit - [facility]- from [employer] service    High cholesterol    History of Doppler ultrasound    recent doppler studies of lower extremities due to pain.  Pt. told that the results were normal.    Hypertension    Neuromuscular disorder ([place])    neuropathy, tremors in R hand at times     Past Surgical History:  Procedure Laterality Date   BACK SURGERY     x2 - lumbar surgeries, cervical fusion x1   EYE SURGERY     L cataract /w IOL   HERNIA REPAIR     R side- inguinal    KNEE ARTHROSCOPY Left    LUMBAR LAMINECTOMY/DECOMPRESSION MICRODISCECTOMY N/A 07/30/2014   Procedure: LUMBAR LAMINECTOMY/DECOMPRESSION MICRODISCECTOMY. LUMBAR TWO/THREE, THREE/FOUR, FOUR/FIVE, FIVE/SACRAL ONE;  Surgeon: Newman Pies, MD;  Location: [facility];  Service: Neurosurgery;  Laterality: N/A;   Patient Active Problem List   Diagnosis Date Noted   Lumbar stenosis with neurogenic claudication 07/30/2014    REFERRING DIAG: s/p right rotator cuff repair   THERAPY DIAG:  Chronic right shoulder pain  Muscle weakness (generalized)  Rationale for  Evaluation and Treatment Rehabilitation  PERTINENT HISTORY: HTN, heart murmur, anxiety, hx of neuropathy in Rt hand, hx of two lumbar surgeries and one cervical fusion, Rotator cuff repair with biceps tenodesis on 06/13/2022   PRECAUTIONS: Shoulder   SUBJECTIVE:  SUBJECTIVE STATEMENT:  Patient reports continued pain, HEP compliance.    PAIN:  Are you having pain? Yes: NPRS scale: 7/10 Pain location: Rt shoulder Pain description: sharp, pins and needles Aggravating factors: Reaching overhead Relieving factors: rest, wearing sling   OBJECTIVE: (objective measures completed at initial evaluation unless otherwise dated)   DIAGNOSTIC FINDINGS:  None recent available in EPIC   PATIENT SURVEYS:  FOTO 51%, predicted 67% in 15 visits   COGNITION: Overall cognitive status: Within functional limits for tasks assessed                                  SENSATION: Not tested   POSTURE: Forward shoulders/ head   UPPER EXTREMITY ROM:    A/PROM Right eval Left eval R 11/28  Shoulder flexion 70p! PROM 160/165 130 P!  Shoulder abduction 75p! PROM 180/185   Shoulder internal rotation 40p! PROM 90/95   Shoulder external rotation 55p! PROM 80/85   (Blank rows = not tested)   UPPER EXTREMITY MMT:   MMT Right eval Left eval  Shoulder flexion NT 5/5  Shoulder extension NT 5/5  Shoulder abduction NT 5/5  Shoulder internal rotation NT 5/5  Shoulder external rotation NT 5/5  Middle trapezius NT 4/5  Lower trapezius NT 3+/5  Latissimus dorsi NT 5/5  Elbow flexion 5/5 5/5  Elbow extension 5/5 5/5  Grip strength (lbs) 88 86  (Blank rows = not tested)     JOINT MOBILITY TESTING:  Deferred due to post-op status   PALPATION:  No TTP about global Rt shoulder             TODAY'S TREATMENT:   [facility] Adult  PT Treatment:                                                DATE: 08/23/2022 Therapeutic Exercise: Pulleys flexion/scaption x2' Pball chest press in supine - 3x10 Pball flexion in supine - 3x10 S/L ER in R S/L - 3x10 with tennis ball Flexion AROM with tennis ball 2x15 UE ranger - 34'' - 3x15 flexion/scaption Manual Therapy: Supine Rt shoulder flexion/ER/abduction PROM with gentle distraction/ vibrations for pain modulation Modalities: 10 minutes of GameReady vasopneumatic treatment at 34d F to Rt shoulder in sitting with no adverse response  [facility] Adult PT Treatment:                                                DATE: 08/18/2022 Therapeutic Exercise: Pulleys flexion/scaption x2' Pball chest press in supine - 3x10 Pball flexion in supine - 3x10 Rhythmic stabilization 90 degrees of flexion x60" S/L ER in R S/L - 2x10 with tennis ball Flexion AROM with tennis ball x15 UE ranger - 34'' - 3x15 Manual Therapy: Supine Rt shoulder flexion/ER/abduction PROM with gentle distraction/ vibrations for pain modulation Modalities: 10 minutes of GameReady vasopneumatic treatment at 34d F to Rt shoulder in sitting with no adverse response  [facility] Adult PT Treatment:  DATE: 08/16/2022 Therapeutic Exercise: Pball chest press in supine - 3x10 Pball flexion in supine - 3x10 Rhythmic stabilization 90 degrees of flexion x2 S/L ER in R S/L - 3x15 UE ranger - 34'' - 3x15  Manual Therapy: Supine Rt shoulder flexion/ER/abduction PROM with gentle distraction/ vibrations for pain modulation  Modalities: 10 minutes of GameReady vasopneumatic treatment at 34d F to Rt shoulder in sitting with no adverse response                                                                             PATIENT EDUCATION: Education details: Pt educated on prognosis, POC, FOTO, and HEP Person educated: Patient Education method: Programmer, multimedia, Facilities manager, and  Handouts Education comprehension: verbalized understanding and returned demonstration   HOME EXERCISE PROGRAM: Access Code: [email or web address] URL: [email or web address] Date: 08/16/2022 Prepared by: Alphonzo Severance  Exercises - Shoulder Scaption AAROM with Dowel  - 1 x daily - 7 x weekly - 3 sets - 10 reps - 5 seconds hold - Standing Shoulder External Rotation AAROM with Dowel (Mirrored)  - 1 x daily - 7 x weekly - 3 sets - 10 reps - 5 seconds hold - Seated Scapular Retraction  - 1 x daily - 7 x weekly - 3 sets - 10 reps - 5 seconds hold - Sidelying Shoulder External Rotation  - 1 x daily - 7 x weekly - 3 sets - 10 reps   ASSESSMENT:   CLINICAL IMPRESSION: Patient presents to PT with continued high levels of baseline pain in his Rt shoulder and reports HEP compliance. Session today continued to focus on progression of AAROM to AROM. Patient was able to tolerate all prescribed exercises with no adverse effects. Patient continues to benefit from skilled PT services and should be progressed as able to improve functional independence.     OBJECTIVE IMPAIRMENTS: decreased endurance, decreased knowledge of condition, decreased mobility, decreased ROM, decreased strength, hypomobility, impaired flexibility, impaired UE functional use, improper body mechanics, postural dysfunction, and pain.    ACTIVITY LIMITATIONS: carrying, lifting, transfers, bed mobility, bathing, dressing, and reach over head   PARTICIPATION LIMITATIONS: meal prep, cleaning, laundry, driving, shopping, community activity, and yard work   PERSONAL FACTORS: 3+ comorbidities: See medical hx  are also affecting patient's functional outcome.       GOALS: Goals reviewed with patient? Yes   SHORT TERM GOALS: Target date: 08/23/2022    Pt will report understanding and adherence to initial HEP in order to promote independence in the management of primary impairments. Baseline: HEP provided at eval Goal status:  INITIAL       LONG TERM GOALS: Target date: 09/20/2022     Pt will achieve a FOTO score of 67% in order to demonstrate improved functional ability as it relates to the pt's primary impairments. Baseline: 51% Goal status: INITIAL   2.  Pt will achieve Rt shoulder flexion and abduction AROM of 140 degrees in order to reach into overhead cabinets with less limitation. Baseline: See AROM chart Goal status: INITIAL   3.  Pt will achieve Rt shoulder ER and IR AROM of 60 degrees in order to get dressed with less limitation. Baseline: See AROM chart Goal status: INITIAL   4.  Pt will achieve global Rt shoulder MMT of 4/5 in order to return to working on his car with less limitation. Baseline: Not tested due to post-op status Goal status: INITIAL   5.  Pt will achieve 10 push-ups with 0-3/10 pain in order to return to his pre-surgery workout level with less limitation. Baseline: Unable due to post-op status Goal status: INITIAL     PLAN:   PT FREQUENCY: 2x/week   PT DURATION: 8 weeks   PLANNED INTERVENTIONS: Therapeutic exercises, Therapeutic activity, Neuromuscular re-education, Patient/Family education, Self Care, Joint mobilization, Dry Needling, Electrical stimulation, Spinal manipulation, Spinal mobilization, Cryotherapy, Moist heat, Taping, Vasopneumatic device, Biofeedback, Ionotophoresis 4mg /ml Dexamethasone, Manual therapy, and Re-evaluation   PLAN FOR NEXT SESSION: Progress early ROM, progress to strengthening in accordance with Dr. Londell Moh RTC repair protocol in lieu of pt-provided protocol   Margarette Canada PTA 08/23/22 12:00 PM

## 2022-08-24 NOTE — Therapy (Signed)
OUTPATIENT PHYSICAL THERAPY TREATMENT NOTE   Patient Name: William Meyer MRN: [id number] DOB:12-30-1945, 76 y.o., male Today's Date: 08/25/2022  PCP: [facility]  REFERRING PROVIDER: Gareth Morgan, MD   END OF SESSION:   PT End of Session - 08/25/22 1128     Visit Number 7    Number of Visits 17    Date for PT Re-Evaluation 09/27/22    Authorization Type [place]    Authorization Time Period FOTO v6, v10    PT Start Time 1130    PT Stop Time 1220    PT Time Calculation (min) 50 min    Activity Tolerance Patient tolerated treatment well    Behavior During Therapy WFL for tasks assessed/performed                Past Medical History:  Diagnosis Date   Anxiety    during the night- wakes up with hot flashes because of panic attacks    Arthritis    degeneration of spine    Heart murmur    minimal- - pt. told that it was detected on his exit - [place]- from [employer] service    High cholesterol    History of Doppler ultrasound    recent doppler studies of lower extremities due to pain.  Pt. told that the results were normal.    Hypertension    Neuromuscular disorder ([place])    neuropathy, tremors in R hand at times     Past Surgical History:  Procedure Laterality Date   BACK SURGERY     x2 - lumbar surgeries, cervical fusion x1   EYE SURGERY     L cataract /w IOL   HERNIA REPAIR     R side- inguinal    KNEE ARTHROSCOPY Left    LUMBAR LAMINECTOMY/DECOMPRESSION MICRODISCECTOMY N/A 07/30/2014   Procedure: LUMBAR LAMINECTOMY/DECOMPRESSION MICRODISCECTOMY. LUMBAR TWO/THREE, THREE/FOUR, FOUR/FIVE, FIVE/SACRAL ONE;  Surgeon: Newman Pies, MD;  Location: [place] [facility];  Service: Neurosurgery;  Laterality: N/A;   Patient Active Problem List   Diagnosis Date Noted   Lumbar stenosis with neurogenic claudication 07/30/2014    REFERRING DIAG: s/p right rotator cuff repair   THERAPY DIAG:  Chronic right shoulder pain  Muscle weakness (generalized)  Rationale  for Evaluation and Treatment Rehabilitation  PERTINENT HISTORY: HTN, heart murmur, anxiety, hx of neuropathy in Rt hand, hx of two lumbar surgeries and one cervical fusion, Rotator cuff repair with biceps tenodesis on 06/13/2022   PRECAUTIONS: Shoulder   SUBJECTIVE:  SUBJECTIVE STATEMENT:  Patient reports increased pain today and thinks he might have slept on it wrong last night.   PAIN:  Are you having pain? Yes: NPRS scale: 9.5/10 Pain location: Rt shoulder Pain description: sharp, pins and needles Aggravating factors: Reaching overhead Relieving factors: rest, wearing sling   OBJECTIVE: (objective measures completed at initial evaluation unless otherwise dated)   DIAGNOSTIC FINDINGS:  None recent available in EPIC   PATIENT SURVEYS:  FOTO 51%, predicted 67% in 15 visits   COGNITION: Overall cognitive status: Within functional limits for tasks assessed                                  SENSATION: Not tested   POSTURE: Forward shoulders/ head   UPPER EXTREMITY ROM:    A/PROM Right eval Left eval R 11/28  Shoulder flexion 70p! PROM 160/165 130 P!  Shoulder abduction 75p! PROM 180/185   Shoulder internal rotation 40p! PROM 90/95   Shoulder external rotation 55p! PROM 80/85   (Blank rows = not tested)   UPPER EXTREMITY MMT:   MMT Right eval Left eval  Shoulder flexion NT 5/5  Shoulder extension NT 5/5  Shoulder abduction NT 5/5  Shoulder internal rotation NT 5/5  Shoulder external rotation NT 5/5  Middle trapezius NT 4/5  Lower trapezius NT 3+/5  Latissimus dorsi NT 5/5  Elbow flexion 5/5 5/5  Elbow extension 5/5 5/5  Grip strength (lbs) 88 86  (Blank rows = not tested)     JOINT MOBILITY TESTING:  Deferred due to post-op status   PALPATION:  No TTP about global Rt  shoulder             TODAY'S TREATMENT:   [facility] Adult PT Treatment:                                                DATE: 08/25/2022 Therapeutic Exercise: Pulleys flexion/scaption x2' Pball chest press in supine - 3x10 Rt chest press - tennis ball 2x10 Pball flexion in supine - 3x10 S/L ER - 3x10 with tennis ball Flexion AROM with tennis ball 2x10 UE ranger - 82'' - 3x15 flexion/scaption Rows YTB 2x10 Manual Therapy: Supine Rt shoulder flexion/ER/abduction PROM with gentle distraction/ vibrations for pain modulation Modalities: 10 minutes of GameReady vasopneumatic treatment at 34d F to Rt shoulder in sitting with no adverse response   [facility] Adult PT Treatment:                                                DATE: 08/23/2022 Therapeutic Exercise: Pulleys flexion/scaption x2' Pball chest press in supine - 3x10 Pball flexion in supine - 3x10 S/L ER in R S/L - 3x10 with tennis ball Flexion AROM with tennis ball 2x15 UE ranger - 70'' - 3x15 flexion/scaption Manual Therapy: Supine Rt shoulder flexion/ER/abduction PROM with gentle distraction/ vibrations for pain modulation Modalities: 10 minutes of GameReady vasopneumatic treatment at 34d F to Rt shoulder in sitting with no adverse response  [facility] Adult PT Treatment:  DATE: 08/18/2022 Therapeutic Exercise: Pulleys flexion/scaption x2' Pball chest press in supine - 3x10 Pball flexion in supine - 3x10 Rhythmic stabilization 90 degrees of flexion x60" S/L ER in R S/L - 2x10 with tennis ball Flexion AROM with tennis ball x15 UE ranger - 34'' - 3x15 Manual Therapy: Supine Rt shoulder flexion/ER/abduction PROM with gentle distraction/ vibrations for pain modulation Modalities: 10 minutes of GameReady vasopneumatic treatment at 34d F to Rt shoulder in sitting with no adverse response                                                                            PATIENT EDUCATION: Education  details: Pt educated on prognosis, POC, FOTO, and HEP Person educated: Patient Education method: Consulting civil engineer, Media planner, and Handouts Education comprehension: verbalized understanding and returned demonstration   HOME EXERCISE PROGRAM: Access Code: [email or web address] URL: [email or web address] Date: 08/16/2022 Prepared by: Shearon Balo  Exercises - Shoulder Scaption AAROM with Dowel  - 1 x daily - 7 x weekly - 3 sets - 10 reps - 5 seconds hold - Standing Shoulder External Rotation AAROM with Dowel (Mirrored)  - 1 x daily - 7 x weekly - 3 sets - 10 reps - 5 seconds hold - Seated Scapular Retraction  - 1 x daily - 7 x weekly - 3 sets - 10 reps - 5 seconds hold - Sidelying Shoulder External Rotation  - 1 x daily - 7 x weekly - 3 sets - 10 reps   ASSESSMENT:   CLINICAL IMPRESSION: Patient presents to PT with increased pain in the anterior portion of his Rt shoulder and reports he might have slept on it incorrectly last night.  Session today continued to focus on progression of AAROM to AROM. Introduced light banded rows today to good affect. Patient was able to tolerate all prescribed exercises with no adverse effects. Patient continues to benefit from skilled PT services and should be progressed as able to improve functional independence.     OBJECTIVE IMPAIRMENTS: decreased endurance, decreased knowledge of condition, decreased mobility, decreased ROM, decreased strength, hypomobility, impaired flexibility, impaired UE functional use, improper body mechanics, postural dysfunction, and pain.    ACTIVITY LIMITATIONS: carrying, lifting, transfers, bed mobility, bathing, dressing, and reach over head   PARTICIPATION LIMITATIONS: meal prep, cleaning, laundry, driving, shopping, community activity, and yard work   PERSONAL FACTORS: 3+ comorbidities: See medical hx  are also affecting patient's functional outcome.       GOALS: Goals reviewed with patient? Yes   SHORT TERM  GOALS: Target date: 08/23/2022    Pt will report understanding and adherence to initial HEP in order to promote independence in the management of primary impairments. Baseline: HEP provided at eval Goal status: MET Pt reports adherence 08/25/22       LONG TERM GOALS: Target date: 09/20/2022     Pt will achieve a FOTO score of 67% in order to demonstrate improved functional ability as it relates to the pt's primary impairments. Baseline: 51% Goal status: INITIAL   2.  Pt will achieve Rt shoulder flexion and abduction AROM of 140 degrees in order to reach into overhead cabinets with less limitation. Baseline: See AROM chart Goal status: INITIAL   3.  Pt  will achieve Rt shoulder ER and IR AROM of 60 degrees in order to get dressed with less limitation. Baseline: See AROM chart Goal status: INITIAL   4.  Pt will achieve global Rt shoulder MMT of 4/5 in order to return to working on his car with less limitation. Baseline: Not tested due to post-op status Goal status: INITIAL   5.  Pt will achieve 10 push-ups with 0-3/10 pain in order to return to his pre-surgery workout level with less limitation. Baseline: Unable due to post-op status Goal status: INITIAL     PLAN:   PT FREQUENCY: 2x/week   PT DURATION: 8 weeks   PLANNED INTERVENTIONS: Therapeutic exercises, Therapeutic activity, Neuromuscular re-education, Patient/Family education, Self Care, Joint mobilization, Dry Needling, Electrical stimulation, Spinal manipulation, Spinal mobilization, Cryotherapy, Moist heat, Taping, Vasopneumatic device, Biofeedback, Ionotophoresis 58m/ml Dexamethasone, Manual therapy, and Re-evaluation   PLAN FOR NEXT SESSION: Progress early ROM, progress to strengthening in accordance with Dr. DLondell MohRTC repair protocol in lieu of pt-provided protocol   SMargarette CanadaPTA 08/25/22 12:13 PM

## 2022-08-25 ENCOUNTER — Ambulatory Visit: Payer: No Typology Code available for payment source

## 2022-08-25 DIAGNOSIS — M25511 Pain in right shoulder: Secondary | ICD-10-CM | POA: Diagnosis not present

## 2022-08-25 DIAGNOSIS — M6281 Muscle weakness (generalized): Secondary | ICD-10-CM

## 2022-08-25 DIAGNOSIS — G8929 Other chronic pain: Secondary | ICD-10-CM

## 2022-08-29 NOTE — Therapy (Signed)
OUTPATIENT PHYSICAL THERAPY TREATMENT NOTE   Patient Name: William Meyer MRN: [id number] DOB:05/12/1946, 76 y.o., male Today's Date: 08/30/2022  PCP: [facility]  REFERRING PROVIDER: Gareth Morgan, MD   END OF SESSION:   PT End of Session - 08/30/22 1114     Visit Number 8    Number of Visits 17    Date for PT Re-Evaluation 09/27/22    Authorization Type [place]    Authorization Time Period FOTO v6, v10    PT Start Time 1130    PT Stop Time 1223    PT Time Calculation (min) 53 min    Activity Tolerance Patient tolerated treatment well    Behavior During Therapy WFL for tasks assessed/performed                 Past Medical History:  Diagnosis Date   Anxiety    during the night- wakes up with hot flashes because of panic attacks    Arthritis    degeneration of spine    Heart murmur    minimal- - pt. told that it was detected on his exit - [place]- from [employer] service    High cholesterol    History of Doppler ultrasound    recent doppler studies of lower extremities due to pain.  Pt. told that the results were normal.    Hypertension    Neuromuscular disorder ([place])    neuropathy, tremors in R hand at times     Past Surgical History:  Procedure Laterality Date   BACK SURGERY     x2 - lumbar surgeries, cervical fusion x1   EYE SURGERY     L cataract /w IOL   HERNIA REPAIR     R side- inguinal    KNEE ARTHROSCOPY Left    LUMBAR LAMINECTOMY/DECOMPRESSION MICRODISCECTOMY N/A 07/30/2014   Procedure: LUMBAR LAMINECTOMY/DECOMPRESSION MICRODISCECTOMY. LUMBAR TWO/THREE, THREE/FOUR, FOUR/FIVE, FIVE/SACRAL ONE;  Surgeon: Newman Pies, MD;  Location: [place] [facility];  Service: Neurosurgery;  Laterality: N/A;   Patient Active Problem List   Diagnosis Date Noted   Lumbar stenosis with neurogenic claudication 07/30/2014    REFERRING DIAG: s/p right rotator cuff repair   THERAPY DIAG:  Chronic right shoulder pain  Muscle weakness (generalized)  Rationale  for Evaluation and Treatment Rehabilitation  PERTINENT HISTORY: HTN, heart murmur, anxiety, hx of neuropathy in Rt hand, hx of two lumbar surgeries and one cervical fusion, Rotator cuff repair with biceps tenodesis on 06/13/2022   PRECAUTIONS: Shoulder   SUBJECTIVE:  SUBJECTIVE STATEMENT:  Patient reports that he might have overdone it over the weekend but that his pain hasn't lingered.    PAIN:  Are you having pain? Yes: NPRS scale: 7/10 Pain location: Rt shoulder Pain description: sharp, pins and needles Aggravating factors: Reaching overhead Relieving factors: rest, wearing sling   OBJECTIVE: (objective measures completed at initial evaluation unless otherwise dated)   DIAGNOSTIC FINDINGS:  None recent available in EPIC   PATIENT SURVEYS:  FOTO 51%, predicted 67% in 15 visits   COGNITION: Overall cognitive status: Within functional limits for tasks assessed                                  SENSATION: Not tested   POSTURE: Forward shoulders/ head   UPPER EXTREMITY ROM:    A/PROM Right eval Left eval R 11/28  Shoulder flexion 70p! PROM 160/165 130 P!  Shoulder abduction 75p! PROM 180/185   Shoulder internal rotation 40p! PROM 90/95   Shoulder external rotation 55p! PROM 80/85   (Blank rows = not tested)   UPPER EXTREMITY MMT:   MMT Right eval Left eval  Shoulder flexion NT 5/5  Shoulder extension NT 5/5  Shoulder abduction NT 5/5  Shoulder internal rotation NT 5/5  Shoulder external rotation NT 5/5  Middle trapezius NT 4/5  Lower trapezius NT 3+/5  Latissimus dorsi NT 5/5  Elbow flexion 5/5 5/5  Elbow extension 5/5 5/5  Grip strength (lbs) 88 86  (Blank rows = not tested)     JOINT MOBILITY TESTING:  Deferred due to post-op status   PALPATION:  No TTP about global Rt  shoulder             TODAY'S TREATMENT:   [facility] Adult PT Treatment:                                                DATE: 08/30/2022 Therapeutic Exercise: Pulleys flexion/scaption x2' Rt chest press - tennis ball 3x10 S/L ER - 3x10 with tennis ball Supine flexion AROM with tennis ball 3x10 Seated shoulder flexion AROM with tennis ball 2x10 (cues to decrease compensation) UE ranger - 44'' - 3x15 flexion/scaption Rows YTB 3x10 Wall slide flexion AROM in pillowcase Rt x15 Manual Therapy: Supine Rt shoulder flexion/ER/abduction PROM with gentle distraction/ vibrations for pain modulation Modalities: 10 minutes of GameReady vasopneumatic treatment at 34d F low compression to Rt shoulder in sitting with no adverse response  [facility] Adult PT Treatment:                                                DATE: 08/25/2022 Therapeutic Exercise: Pulleys flexion/scaption x2' Pball chest press in supine - 3x10 Rt chest press - tennis ball 2x10 Pball flexion in supine - 3x10 S/L ER - 3x10 with tennis ball Flexion AROM with tennis ball 2x10 UE ranger - 82'' - 3x15 flexion/scaption Rows YTB 2x10 Manual Therapy: Supine Rt shoulder flexion/ER/abduction PROM with gentle distraction/ vibrations for pain modulation Modalities: 10 minutes of GameReady vasopneumatic treatment at 34d F to Rt shoulder in sitting with no adverse response   [facility] Adult PT Treatment:  DATE: 08/23/2022 Therapeutic Exercise: Pulleys flexion/scaption x2' Pball chest press in supine - 3x10 Pball flexion in supine - 3x10 S/L ER in R S/L - 3x10 with tennis ball Flexion AROM with tennis ball 2x15 UE ranger - 71'' - 3x15 flexion/scaption Manual Therapy: Supine Rt shoulder flexion/ER/abduction PROM with gentle distraction/ vibrations for pain modulation Modalities: 10 minutes of GameReady vasopneumatic treatment at 34d F to Rt shoulder in sitting with no adverse response                                                                             PATIENT EDUCATION: Education details: Pt educated on prognosis, POC, FOTO, and HEP Person educated: Patient Education method: Consulting civil engineer, Media planner, and Handouts Education comprehension: verbalized understanding and returned demonstration   HOME EXERCISE PROGRAM: Access Code: [email or web address] URL: [email or web address] Date: 08/16/2022 Prepared by: Shearon Balo  Exercises - Shoulder Scaption AAROM with Dowel  - 1 x daily - 7 x weekly - 3 sets - 10 reps - 5 seconds hold - Standing Shoulder External Rotation AAROM with Dowel (Mirrored)  - 1 x daily - 7 x weekly - 3 sets - 10 reps - 5 seconds hold - Seated Scapular Retraction  - 1 x daily - 7 x weekly - 3 sets - 10 reps - 5 seconds hold - Sidelying Shoulder External Rotation  - 1 x daily - 7 x weekly - 3 sets - 10 reps   ASSESSMENT:   CLINICAL IMPRESSION: Patient presents to PT with improved pain levels from last session and reports HEP compliance. Session today focused on progressing AROM from supine to sitting with verbal cues needed to decrease compensation of shoulder elevation once past neutral with shoulder flexion. Patient was able to tolerate all prescribed exercises with no adverse effects. Patient continues to benefit from skilled PT services and should be progressed as able to improve functional independence.     OBJECTIVE IMPAIRMENTS: decreased endurance, decreased knowledge of condition, decreased mobility, decreased ROM, decreased strength, hypomobility, impaired flexibility, impaired UE functional use, improper body mechanics, postural dysfunction, and pain.    ACTIVITY LIMITATIONS: carrying, lifting, transfers, bed mobility, bathing, dressing, and reach over head   PARTICIPATION LIMITATIONS: meal prep, cleaning, laundry, driving, shopping, community activity, and yard work   PERSONAL FACTORS: 3+ comorbidities: See medical hx  are also affecting  patient's functional outcome.       GOALS: Goals reviewed with patient? Yes   SHORT TERM GOALS: Target date: 08/23/2022    Pt will report understanding and adherence to initial HEP in order to promote independence in the management of primary impairments. Baseline: HEP provided at eval Goal status: MET Pt reports adherence 08/25/22       LONG TERM GOALS: Target date: 09/20/2022     Pt will achieve a FOTO score of 67% in order to demonstrate improved functional ability as it relates to the pt's primary impairments. Baseline: 51% Goal status: INITIAL   2.  Pt will achieve Rt shoulder flexion and abduction AROM of 140 degrees in order to reach into overhead cabinets with less limitation. Baseline: See AROM chart Goal status: INITIAL   3.  Pt will achieve Rt shoulder ER and IR AROM of 60 degrees  in order to get dressed with less limitation. Baseline: See AROM chart Goal status: INITIAL   4.  Pt will achieve global Rt shoulder MMT of 4/5 in order to return to working on his car with less limitation. Baseline: Not tested due to post-op status Goal status: INITIAL   5.  Pt will achieve 10 push-ups with 0-3/10 pain in order to return to his pre-surgery workout level with less limitation. Baseline: Unable due to post-op status Goal status: INITIAL     PLAN:   PT FREQUENCY: 2x/week   PT DURATION: 8 weeks   PLANNED INTERVENTIONS: Therapeutic exercises, Therapeutic activity, Neuromuscular re-education, Patient/Family education, Self Care, Joint mobilization, Dry Needling, Electrical stimulation, Spinal manipulation, Spinal mobilization, Cryotherapy, Moist heat, Taping, Vasopneumatic device, Biofeedback, Ionotophoresis 40m/ml Dexamethasone, Manual therapy, and Re-evaluation   PLAN FOR NEXT SESSION: Progress early ROM, progress to strengthening in accordance with Dr. DLondell MohRTC repair protocol in lieu of pt-provided protocol   SMargarette CanadaPTA 08/30/22 12:19 PM

## 2022-08-30 ENCOUNTER — Ambulatory Visit: Payer: No Typology Code available for payment source

## 2022-08-30 DIAGNOSIS — M25511 Pain in right shoulder: Secondary | ICD-10-CM | POA: Diagnosis not present

## 2022-08-30 DIAGNOSIS — M6281 Muscle weakness (generalized): Secondary | ICD-10-CM

## 2022-08-30 DIAGNOSIS — G8929 Other chronic pain: Secondary | ICD-10-CM

## 2022-08-31 NOTE — Therapy (Signed)
OUTPATIENT PHYSICAL THERAPY TREATMENT NOTE   Patient Name: William Meyer MRN: [id number] DOB:January 08, 1946, 76 y.o., male Today's Date: 09/01/2022  PCP: [facility]  REFERRING PROVIDER: Gareth Morgan, MD   END OF SESSION:   PT End of Session - 09/01/22 0956     Visit Number 9    Number of Visits 17    Date for PT Re-Evaluation 09/27/22    Authorization Type [place]    Authorization Time Period FOTO v6, v10    PT Start Time 1000    PT Stop Time 1045    PT Time Calculation (min) 45 min    Activity Tolerance Patient tolerated treatment well    Behavior During Therapy WFL for tasks assessed/performed             Past Medical History:  Diagnosis Date   Anxiety    during the night- wakes up with hot flashes because of panic attacks    Arthritis    degeneration of spine    Heart murmur    minimal- - pt. told that it was detected on his exit - [place]- from [employer] service    High cholesterol    History of Doppler ultrasound    recent doppler studies of lower extremities due to pain.  Pt. told that the results were normal.    Hypertension    Neuromuscular disorder ([place])    neuropathy, tremors in R hand at times     Past Surgical History:  Procedure Laterality Date   BACK SURGERY     x2 - lumbar surgeries, cervical fusion x1   EYE SURGERY     L cataract /w IOL   HERNIA REPAIR     R side- inguinal    KNEE ARTHROSCOPY Left    LUMBAR LAMINECTOMY/DECOMPRESSION MICRODISCECTOMY N/A 07/30/2014   Procedure: LUMBAR LAMINECTOMY/DECOMPRESSION MICRODISCECTOMY. LUMBAR TWO/THREE, THREE/FOUR, FOUR/FIVE, FIVE/SACRAL ONE;  Surgeon: Newman Pies, MD;  Location: [place] [facility];  Service: Neurosurgery;  Laterality: N/A;   Patient Active Problem List   Diagnosis Date Noted   Lumbar stenosis with neurogenic claudication 07/30/2014    REFERRING DIAG: s/p right rotator cuff repair   THERAPY DIAG:  Chronic right shoulder pain  Muscle weakness (generalized)  Rationale for  Evaluation and Treatment Rehabilitation  PERTINENT HISTORY: HTN, heart murmur, anxiety, hx of neuropathy in Rt hand, hx of two lumbar surgeries and one cervical fusion, Rotator cuff repair with biceps tenodesis on 06/13/2022   PRECAUTIONS: Shoulder   SUBJECTIVE:  SUBJECTIVE STATEMENT:  Patient reports that he did not have any soreness after last session.    PAIN:  Are you having pain? Yes: NPRS scale: 5/10 Pain location: Rt shoulder Pain description: sharp, pins and needles Aggravating factors: Reaching overhead Relieving factors: rest, wearing sling   OBJECTIVE: (objective measures completed at initial evaluation unless otherwise dated)   DIAGNOSTIC FINDINGS:  None recent available in EPIC   PATIENT SURVEYS:  FOTO 51%, predicted 67% in 15 visits   COGNITION: Overall cognitive status: Within functional limits for tasks assessed                                  SENSATION: Not tested   POSTURE: Forward shoulders/ head   UPPER EXTREMITY ROM:    A/PROM Right eval Left eval R 11/28  Shoulder flexion 70p! PROM 160/165 130 P!  Shoulder abduction 75p! PROM 180/185   Shoulder internal rotation 40p! PROM 90/95   Shoulder external rotation 55p! PROM 80/85   (Blank rows = not tested)   UPPER EXTREMITY MMT:   MMT Right eval Left eval  Shoulder flexion NT 5/5  Shoulder extension NT 5/5  Shoulder abduction NT 5/5  Shoulder internal rotation NT 5/5  Shoulder external rotation NT 5/5  Middle trapezius NT 4/5  Lower trapezius NT 3+/5  Latissimus dorsi NT 5/5  Elbow flexion 5/5 5/5  Elbow extension 5/5 5/5  Grip strength (lbs) 88 86  (Blank rows = not tested)     JOINT MOBILITY TESTING:  Deferred due to post-op status   PALPATION:  No TTP about global Rt shoulder             TODAY'S  TREATMENT:   [facility] Adult PT Treatment:                                                DATE: 09/01/2022 Therapeutic Exercise: Pulleys flexion/scaption x2' Supine Rt chest press - tennis ball 3x10 S/L ER - 3x10 with tennis ball Supine flexion AROM with tennis ball 3x10 Seated shoulder flexion AROM with tennis ball 2x10 (cues to decrease compensation) Seated scaption AROM with tennis ball 2x10 Rows YTB 3x10 Shoulder extension YTB 2x10 Wall slide flexion AROM Rt x15 Manual Therapy: Supine Rt shoulder flexion/ER/abduction PROM with gentle distraction/ vibrations for pain modulation Modalities: 10 minutes of GameReady vasopneumatic treatment at 34d F low compression to Rt shoulder in sitting with no adverse response  [facility] Adult PT Treatment:                                                DATE: 08/30/2022 Therapeutic Exercise: Pulleys flexion/scaption x2' Rt chest press - tennis ball 3x10 S/L ER - 3x10 with tennis ball Supine flexion AROM with tennis ball 3x10 Seated shoulder flexion AROM with tennis ball 2x10 (cues to decrease compensation) UE ranger - 36'' - 3x15 flexion/scaption Rows YTB 3x10 Wall slide flexion AROM in pillowcase Rt x15 Manual Therapy: Supine Rt shoulder flexion/ER/abduction PROM with gentle distraction/ vibrations for pain modulation Modalities: 10 minutes of GameReady vasopneumatic treatment at 34d F low compression to Rt shoulder in sitting with no adverse response  [facility] Adult PT Treatment:  DATE: 08/25/2022 Therapeutic Exercise: Pulleys flexion/scaption x2' Pball chest press in supine - 3x10 Rt chest press - tennis ball 2x10 Pball flexion in supine - 3x10 S/L ER - 3x10 with tennis ball Flexion AROM with tennis ball 2x10 UE ranger - 65'' - 3x15 flexion/scaption Rows YTB 2x10 Manual Therapy: Supine Rt shoulder flexion/ER/abduction PROM with gentle distraction/ vibrations for pain modulation Modalities: 10 minutes  of GameReady vasopneumatic treatment at 34d F to Rt shoulder in sitting with no adverse response                                                                            PATIENT EDUCATION: Education details: Pt educated on prognosis, POC, FOTO, and HEP Person educated: Patient Education method: Consulting civil engineer, Media planner, and Handouts Education comprehension: verbalized understanding and returned demonstration   HOME EXERCISE PROGRAM: Access Code: [email or web address] URL: [email or web address] Date: 08/16/2022 Prepared by: Shearon Balo  Exercises - Shoulder Scaption AAROM with Dowel  - 1 x daily - 7 x weekly - 3 sets - 10 reps - 5 seconds hold - Standing Shoulder External Rotation AAROM with Dowel (Mirrored)  - 1 x daily - 7 x weekly - 3 sets - 10 reps - 5 seconds hold - Seated Scapular Retraction  - 1 x daily - 7 x weekly - 3 sets - 10 reps - 5 seconds hold - Sidelying Shoulder External Rotation  - 1 x daily - 7 x weekly - 3 sets - 10 reps   ASSESSMENT:   CLINICAL IMPRESSION: Patient presents to PT with moderate pain in his Rt shoulder and reports HEP compliance. Session today continued to focus on progressing AROM with occasional verbal cues needed to depress shoulders and prevent compensation with motions greater than 90. Patient was able to tolerate all prescribed exercises with no adverse effects. Patient continues to benefit from skilled PT services and should be progressed as able to improve functional independence.    OBJECTIVE IMPAIRMENTS: decreased endurance, decreased knowledge of condition, decreased mobility, decreased ROM, decreased strength, hypomobility, impaired flexibility, impaired UE functional use, improper body mechanics, postural dysfunction, and pain.    ACTIVITY LIMITATIONS: carrying, lifting, transfers, bed mobility, bathing, dressing, and reach over head   PARTICIPATION LIMITATIONS: meal prep, cleaning, laundry, driving, shopping, community  activity, and yard work   PERSONAL FACTORS: 3+ comorbidities: See medical hx  are also affecting patient's functional outcome.       GOALS: Goals reviewed with patient? Yes   SHORT TERM GOALS: Target date: 08/23/2022    Pt will report understanding and adherence to initial HEP in order to promote independence in the management of primary impairments. Baseline: HEP provided at eval Goal status: MET Pt reports adherence 08/25/22       LONG TERM GOALS: Target date: 09/20/2022     Pt will achieve a FOTO score of 67% in order to demonstrate improved functional ability as it relates to the pt's primary impairments. Baseline: 51% Goal status: INITIAL   2.  Pt will achieve Rt shoulder flexion and abduction AROM of 140 degrees in order to reach into overhead cabinets with less limitation. Baseline: See AROM chart Goal status: INITIAL   3.  Pt will achieve Rt shoulder ER and IR  AROM of 60 degrees in order to get dressed with less limitation. Baseline: See AROM chart Goal status: INITIAL   4.  Pt will achieve global Rt shoulder MMT of 4/5 in order to return to working on his car with less limitation. Baseline: Not tested due to post-op status Goal status: INITIAL   5.  Pt will achieve 10 push-ups with 0-3/10 pain in order to return to his pre-surgery workout level with less limitation. Baseline: Unable due to post-op status Goal status: INITIAL     PLAN:   PT FREQUENCY: 2x/week   PT DURATION: 8 weeks   PLANNED INTERVENTIONS: Therapeutic exercises, Therapeutic activity, Neuromuscular re-education, Patient/Family education, Self Care, Joint mobilization, Dry Needling, Electrical stimulation, Spinal manipulation, Spinal mobilization, Cryotherapy, Moist heat, Taping, Vasopneumatic device, Biofeedback, Ionotophoresis 35m/ml Dexamethasone, Manual therapy, and Re-evaluation   PLAN FOR NEXT SESSION: Progress early ROM, progress to strengthening in accordance with Dr. DLondell MohRTC repair  protocol in lieu of pt-provided protocol   SMargarette CanadaPTA 09/01/22 9:56 AM

## 2022-09-01 ENCOUNTER — Ambulatory Visit: Payer: No Typology Code available for payment source

## 2022-09-01 DIAGNOSIS — M6281 Muscle weakness (generalized): Secondary | ICD-10-CM

## 2022-09-01 DIAGNOSIS — G8929 Other chronic pain: Secondary | ICD-10-CM

## 2022-09-01 DIAGNOSIS — M25511 Pain in right shoulder: Secondary | ICD-10-CM | POA: Diagnosis not present

## 2022-09-05 NOTE — Therapy (Signed)
OUTPATIENT PHYSICAL THERAPY TREATMENT NOTE   Patient Name: William Meyer MRN: [id number] DOB:1946/05/13, 76 y.o., male Today's Date: 09/06/2022  PCP: [facility]  REFERRING PROVIDER: Gareth Morgan, MD   END OF SESSION:   PT End of Session - 09/06/22 1122     Visit Number 10    Number of Visits 17    Date for PT Re-Evaluation 09/27/22    Authorization Type [place]    Authorization Time Period FOTO v6, v10; 15 visits 9/26-1/24/24  b    Authorization - Visit Number 9    Authorization - Number of Visits 15    PT Start Time 1125    PT Stop Time 1220    PT Time Calculation (min) 55 min    Activity Tolerance Patient tolerated treatment well    Behavior During Therapy WFL for tasks assessed/performed              Past Medical History:  Diagnosis Date   Anxiety    during the night- wakes up with hot flashes because of panic attacks    Arthritis    degeneration of spine    Heart murmur    minimal- - pt. told that it was detected on his exit - [place]- from [employer] service    High cholesterol    History of Doppler ultrasound    recent doppler studies of lower extremities due to pain.  Pt. told that the results were normal.    Hypertension    Neuromuscular disorder ([place])    neuropathy, tremors in R hand at times     Past Surgical History:  Procedure Laterality Date   BACK SURGERY     x2 - lumbar surgeries, cervical fusion x1   EYE SURGERY     L cataract /w IOL   HERNIA REPAIR     R side- inguinal    KNEE ARTHROSCOPY Left    LUMBAR LAMINECTOMY/DECOMPRESSION MICRODISCECTOMY N/A 07/30/2014   Procedure: LUMBAR LAMINECTOMY/DECOMPRESSION MICRODISCECTOMY. LUMBAR TWO/THREE, THREE/FOUR, FOUR/FIVE, FIVE/SACRAL ONE;  Surgeon: Newman Pies, MD;  Location: [place] [facility];  Service: Neurosurgery;  Laterality: N/A;   Patient Active Problem List   Diagnosis Date Noted   Lumbar stenosis with neurogenic claudication 07/30/2014    REFERRING DIAG: s/p right rotator cuff  repair   THERAPY DIAG:  Chronic right shoulder pain  Muscle weakness (generalized)  Rationale for Evaluation and Treatment Rehabilitation  PERTINENT HISTORY: HTN, heart murmur, anxiety, hx of neuropathy in Rt hand, hx of two lumbar surgeries and one cervical fusion, Rotator cuff repair with biceps tenodesis on 06/13/2022   PRECAUTIONS: Shoulder   SUBJECTIVE:  SUBJECTIVE STATEMENT:  Patient reports some shoulder pain today.    PAIN:  Are you having pain? Yes: NPRS scale: 7/10 Pain location: Rt shoulder Pain description: sharp, pins and needles Aggravating factors: Reaching overhead Relieving factors: rest, wearing sling   OBJECTIVE: (objective measures completed at initial evaluation unless otherwise dated)   DIAGNOSTIC FINDINGS:  None recent available in EPIC   PATIENT SURVEYS:  FOTO 51%, predicted 67% in 15 visits 09/06/22:    COGNITION: Overall cognitive status: Within functional limits for tasks assessed                                  SENSATION: Not tested   POSTURE: Forward shoulders/ head   UPPER EXTREMITY ROM:    A/PROM Right eval Left eval R 11/28 R 09/06/22  Shoulder flexion 70p! PROM 160/165 130 P! 128/132  Shoulder abduction 75p! PROM 180/185  90/110  Shoulder internal rotation 40p! PROM 90/95  85  Shoulder external rotation 55p! PROM 80/85  40p!  (Blank rows = not tested)   UPPER EXTREMITY MMT:   MMT Right eval Left eval  Shoulder flexion NT 5/5  Shoulder extension NT 5/5  Shoulder abduction NT 5/5  Shoulder internal rotation NT 5/5  Shoulder external rotation NT 5/5  Middle trapezius NT 4/5  Lower trapezius NT 3+/5  Latissimus dorsi NT 5/5  Elbow flexion 5/5 5/5  Elbow extension 5/5 5/5  Grip strength (lbs) 88 86  (Blank rows = not tested)     JOINT  MOBILITY TESTING:  Deferred due to post-op status   PALPATION:  No TTP about global Rt shoulder             TODAY'S TREATMENT:   [facility] Adult PT Treatment:                                                DATE: 09/06/2022 Therapeutic Exercise: UBE level 1 4' fwd (bwd painful) Supine Rt chest press - 500g ball 3x10 S/L ER - 3x10 500g ball Supine circles at 90 flex 500g ball x10 CW/CCW Supine flexion AROM with 500g ball 3x10 Seated shoulder flexion AROM with tennis ball 2x10 (cues to decrease compensation) Seated scaption AROM with tennis ball 2x10 Rows RTB 3x10 Shoulder extension YTB 3x10 Manual Therapy: Supine Rt shoulder flexion/ER/abduction PROM with gentle distraction/ vibrations for pain modulation Therapeutic Activity: Re-administration of FOTO ROM measurements Modalities: 10 minutes of GameReady vasopneumatic treatment at 34d F low compression to Rt shoulder in sitting with no adverse response   [facility] Adult PT Treatment:                                                DATE: 09/01/2022 Therapeutic Exercise: Pulleys flexion/scaption x2' Supine Rt chest press - tennis ball 3x10 S/L ER - 3x10 with tennis ball Supine flexion AROM with tennis ball 3x10 Seated shoulder flexion AROM with tennis ball 2x10 (cues to decrease compensation) Seated scaption AROM with tennis ball 2x10 Rows YTB 3x10 Shoulder extension YTB 2x10 Wall slide flexion AROM Rt x15 Manual Therapy: Supine Rt shoulder flexion/ER/abduction PROM with gentle distraction/ vibrations for pain modulation Modalities: 10 minutes of GameReady vasopneumatic treatment  at [place] F low compression to Rt shoulder in sitting with no adverse response  [facility] Adult PT Treatment:                                                DATE: 08/30/2022 Therapeutic Exercise: Pulleys flexion/scaption x2' Rt chest press - tennis ball 3x10 S/L ER - 3x10 with tennis ball Supine flexion AROM with tennis ball 3x10 Seated shoulder flexion AROM with  tennis ball 2x10 (cues to decrease compensation) UE ranger - 108'' - 3x15 flexion/scaption Rows YTB 3x10 Wall slide flexion AROM in pillowcase Rt x15 Manual Therapy: Supine Rt shoulder flexion/ER/abduction PROM with gentle distraction/ vibrations for pain modulation Modalities: 10 minutes of GameReady vasopneumatic treatment at 34d F low compression to Rt shoulder in sitting with no adverse response                                                                            PATIENT EDUCATION: Education details: Pt educated on prognosis, POC, FOTO, and HEP Person educated: Patient Education method: Consulting civil engineer, Media planner, and Handouts Education comprehension: verbalized understanding and returned demonstration   HOME EXERCISE PROGRAM: Access Code: [email or web address] URL: [email or web address] Date: 08/16/2022 Prepared by: Shearon Balo  Exercises - Shoulder Scaption AAROM with Dowel  - 1 x daily - 7 x weekly - 3 sets - 10 reps - 5 seconds hold - Standing Shoulder External Rotation AAROM with Dowel (Mirrored)  - 1 x daily - 7 x weekly - 3 sets - 10 reps - 5 seconds hold - Seated Scapular Retraction  - 1 x daily - 7 x weekly - 3 sets - 10 reps - 5 seconds hold - Sidelying Shoulder External Rotation  - 1 x daily - 7 x weekly - 3 sets - 10 reps   ASSESSMENT:   CLINICAL IMPRESSION: Patient presents to PT with moderate pain in his Rt shoulder. Patient has reached 12 weeks post op, incorporated more strengthening and AROM above shoulder height today to good effect. He does require occasional cues to decrease compensation, but is able to correct with minimal cuing. Re-administered FOTO with patient making good progress towards his LTG. His ROM into flexion, abduction and internal rotation have improved greatly and have less pain. His ER remains painful and restricted. Patient was able to tolerate all prescribed exercises with no adverse effects. Patient continues to benefit from  skilled PT services and should be progressed as able to improve functional independence.     OBJECTIVE IMPAIRMENTS: decreased endurance, decreased knowledge of condition, decreased mobility, decreased ROM, decreased strength, hypomobility, impaired flexibility, impaired UE functional use, improper body mechanics, postural dysfunction, and pain.    ACTIVITY LIMITATIONS: carrying, lifting, transfers, bed mobility, bathing, dressing, and reach over head   PARTICIPATION LIMITATIONS: meal prep, cleaning, laundry, driving, shopping, community activity, and yard work   PERSONAL FACTORS: 3+ comorbidities: See medical hx  are also affecting patient's functional outcome.       GOALS: Goals reviewed with patient? Yes   SHORT TERM GOALS: Target date: 08/23/2022    Pt will report understanding and adherence  to initial HEP in order to promote independence in the management of primary impairments. Baseline: HEP provided at eval Goal status: MET Pt reports adherence 08/25/22       LONG TERM GOALS: Target date: 09/20/2022     Pt will achieve a FOTO score of 67% in order to demonstrate improved functional ability as it relates to the pt's primary impairments. Baseline: 51% Goal status: Progressing 09/06/22: 64%   2.  Pt will achieve Rt shoulder flexion and abduction AROM of 140 degrees in order to reach into overhead cabinets with less limitation. Baseline: See AROM chart Goal status: INITIAL   3.  Pt will achieve Rt shoulder ER and IR AROM of 60 degrees in order to get dressed with less limitation. Baseline: See AROM chart Goal status: INITIAL   4.  Pt will achieve global Rt shoulder MMT of 4/5 in order to return to working on his car with less limitation. Baseline: Not tested due to post-op status Goal status: INITIAL   5.  Pt will achieve 10 push-ups with 0-3/10 pain in order to return to his pre-surgery workout level with less limitation. Baseline: Unable due to post-op status Goal  status: INITIAL     PLAN:   PT FREQUENCY: 2x/week   PT DURATION: 8 weeks   PLANNED INTERVENTIONS: Therapeutic exercises, Therapeutic activity, Neuromuscular re-education, Patient/Family education, Self Care, Joint mobilization, Dry Needling, Electrical stimulation, Spinal manipulation, Spinal mobilization, Cryotherapy, Moist heat, Taping, Vasopneumatic device, Biofeedback, Ionotophoresis 29m/ml Dexamethasone, Manual therapy, and Re-evaluation   PLAN FOR NEXT SESSION: Progress early ROM, progress to strengthening in accordance with Dr. DLondell MohRTC repair protocol in lieu of pt-provided protocol   SMargarette CanadaPTA 09/06/22 12:11 PM

## 2022-09-06 ENCOUNTER — Ambulatory Visit: Payer: No Typology Code available for payment source

## 2022-09-06 DIAGNOSIS — G8929 Other chronic pain: Secondary | ICD-10-CM

## 2022-09-06 DIAGNOSIS — M6281 Muscle weakness (generalized): Secondary | ICD-10-CM

## 2022-09-06 DIAGNOSIS — M25511 Pain in right shoulder: Secondary | ICD-10-CM | POA: Diagnosis not present

## 2022-09-07 NOTE — Therapy (Signed)
OUTPATIENT PHYSICAL THERAPY TREATMENT NOTE   Patient Name: William Meyer MRN: [id number] DOB:1946-07-03, 76 y.o., male Today's Date: 09/08/2022  PCP: [facility]  REFERRING PROVIDER: Gareth Morgan, MD   END OF SESSION:   PT End of Session - 09/08/22 1124     Visit Number 11    Number of Visits 17    Date for PT Re-Evaluation 09/27/22    Authorization Type [place]    Authorization Time Period FOTO v6, v10; 15 visits 9/26-1/24/24  b    Authorization - Visit Number 10    Authorization - Number of Visits 15    PT Start Time 1130    PT Stop Time 1210    PT Time Calculation (min) 40 min    Activity Tolerance Patient tolerated treatment well    Behavior During Therapy WFL for tasks assessed/performed               Past Medical History:  Diagnosis Date   Anxiety    during the night- wakes up with hot flashes because of panic attacks    Arthritis    degeneration of spine    Heart murmur    minimal- - pt. told that it was detected on his exit - [place]- from [employer] service    High cholesterol    History of Doppler ultrasound    recent doppler studies of lower extremities due to pain.  Pt. told that the results were normal.    Hypertension    Neuromuscular disorder ([place])    neuropathy, tremors in R hand at times     Past Surgical History:  Procedure Laterality Date   BACK SURGERY     x2 - lumbar surgeries, cervical fusion x1   EYE SURGERY     L cataract /w IOL   HERNIA REPAIR     R side- inguinal    KNEE ARTHROSCOPY Left    LUMBAR LAMINECTOMY/DECOMPRESSION MICRODISCECTOMY N/A 07/30/2014   Procedure: LUMBAR LAMINECTOMY/DECOMPRESSION MICRODISCECTOMY. LUMBAR TWO/THREE, THREE/FOUR, FOUR/FIVE, FIVE/SACRAL ONE;  Surgeon: Newman Pies, MD;  Location: [place] [facility];  Service: Neurosurgery;  Laterality: N/A;   Patient Active Problem List   Diagnosis Date Noted   Lumbar stenosis with neurogenic claudication 07/30/2014    REFERRING DIAG: s/p right rotator cuff  repair   THERAPY DIAG:  Chronic right shoulder pain  Muscle weakness (generalized)  Rationale for Evaluation and Treatment Rehabilitation  PERTINENT HISTORY: HTN, heart murmur, anxiety, hx of neuropathy in Rt hand, hx of two lumbar surgeries and one cervical fusion, Rotator cuff repair with biceps tenodesis on 06/13/2022   PRECAUTIONS: Shoulder   SUBJECTIVE:  SUBJECTIVE STATEMENT: Patient reports his lower back is bothering him more than his shoulder today.  PAIN:  Are you having pain? Yes: NPRS scale: 7/10 Pain location: Rt shoulder Pain description: sharp, pins and needles Aggravating factors: Reaching overhead Relieving factors: rest, wearing sling   OBJECTIVE: (objective measures completed at initial evaluation unless otherwise dated)   DIAGNOSTIC FINDINGS:  None recent available in EPIC   PATIENT SURVEYS:  FOTO 51%, predicted 67% in 15 visits 09/06/22:    COGNITION: Overall cognitive status: Within functional limits for tasks assessed                                  SENSATION: Not tested   POSTURE: Forward shoulders/ head   UPPER EXTREMITY ROM:    A/PROM Right eval Left eval R 11/28 R 09/06/22  Shoulder flexion 70p! PROM 160/165 130 P! 128/132  Shoulder abduction 75p! PROM 180/185  90/110  Shoulder internal rotation 40p! PROM 90/95  85  Shoulder external rotation 55p! PROM 80/85  40p!  (Blank rows = not tested)   UPPER EXTREMITY MMT:   MMT Right eval Left eval  Shoulder flexion NT 5/5  Shoulder extension NT 5/5  Shoulder abduction NT 5/5  Shoulder internal rotation NT 5/5  Shoulder external rotation NT 5/5  Middle trapezius NT 4/5  Lower trapezius NT 3+/5  Latissimus dorsi NT 5/5  Elbow flexion 5/5 5/5  Elbow extension 5/5 5/5  Grip strength (lbs) 88 86  (Blank  rows = not tested)     JOINT MOBILITY TESTING:  Deferred due to post-op status   PALPATION:  No TTP about global Rt shoulder             TODAY'S TREATMENT:   [facility] Adult PT Treatment:                                                DATE: 09/08/2022 Therapeutic Exercise: UBE level 1 x 4' fwd and 1' bwd (not painful today) Supine Rt chest press - 1000g ball 3x10 S/L ER - 3x10 500g ball Sidelying abduction 500g ball 2x10 Supine circles at 90 flex 500g ball x15 CW/CCW Supine flexion AROM with 1000g ball x10, 500g ball x10 Seated shoulder flexion AROM with tennis ball 3x10 (cues to decrease compensation) Seated scaption AROM with tennis ball 2x10 Rows RTB 3x10 Shoulder extension RTB 3x10 BIL shoulder flexion and ER in pillowcase sliding up wall 3x10 Modalities: 10 minutes of GameReady vasopneumatic treatment at 34d F low compression to Rt shoulder in sitting with no adverse response  [facility] Adult PT Treatment:                                                DATE: 09/06/2022 Therapeutic Exercise: UBE level 1 4' fwd (bwd painful) Supine Rt chest press - 500g ball 3x10 S/L ER - 3x10 500g ball Supine circles at 90 flex 500g ball x10 CW/CCW Supine flexion AROM with 500g ball 3x10 Seated shoulder flexion AROM with tennis ball 2x10 (cues to decrease compensation) Seated scaption AROM with tennis ball 2x10 Rows RTB 3x10 Shoulder extension YTB 3x10 Manual Therapy: Supine Rt shoulder flexion/ER/abduction PROM with gentle  distraction/ vibrations for pain modulation Therapeutic Activity: Re-administration of FOTO ROM measurements Modalities: 10 minutes of GameReady vasopneumatic treatment at 34d F low compression to Rt shoulder in sitting with no adverse response   [facility] Adult PT Treatment:                                                DATE: 09/01/2022 Therapeutic Exercise: Pulleys flexion/scaption x2' Supine Rt chest press - tennis ball 3x10 S/L ER - 3x10 with tennis ball Supine  flexion AROM with tennis ball 3x10 Seated shoulder flexion AROM with tennis ball 2x10 (cues to decrease compensation) Seated scaption AROM with tennis ball 2x10 Rows YTB 3x10 Shoulder extension YTB 2x10 Wall slide flexion AROM Rt x15 Manual Therapy: Supine Rt shoulder flexion/ER/abduction PROM with gentle distraction/ vibrations for pain modulation Modalities: 10 minutes of GameReady vasopneumatic treatment at 34d F low compression to Rt shoulder in sitting with no adverse response                                                                            PATIENT EDUCATION: Education details: Pt educated on prognosis, POC, FOTO, and HEP Person educated: Patient Education method: Consulting civil engineer, Media planner, and Handouts Education comprehension: verbalized understanding and returned demonstration   HOME EXERCISE PROGRAM: Access Code: [email or web address] URL: [email or web address] Date: 08/16/2022 Prepared by: Shearon Balo  Exercises - Shoulder Scaption AAROM with Dowel  - 1 x daily - 7 x weekly - 3 sets - 10 reps - 5 seconds hold - Standing Shoulder External Rotation AAROM with Dowel (Mirrored)  - 1 x daily - 7 x weekly - 3 sets - 10 reps - 5 seconds hold - Seated Scapular Retraction  - 1 x daily - 7 x weekly - 3 sets - 10 reps - 5 seconds hold - Sidelying Shoulder External Rotation  - 1 x daily - 7 x weekly - 3 sets - 10 reps   ASSESSMENT:   CLINICAL IMPRESSION: Patient presents to PT with continued Rt shoulder pain and also lower back pain today that he states is a chronic issue that "comes and goes," Session today continued to progress shoulder strengthening and ROM with increased resistance and repetitions as noted to good affect with no increase in pain throughout session. Focused more on therapeutic exercises and less on manual techniques today, but he does still report benefit from vaso at end of session with decrease in pain afterwards. He remains limited in [facility] motions  with cues required to decrease compensation above 90 flexion and abduction. Patient was able to tolerate all prescribed exercises with no adverse effects. Patient continues to benefit from skilled PT services and should be progressed as able to improve functional independence.    OBJECTIVE IMPAIRMENTS: decreased endurance, decreased knowledge of condition, decreased mobility, decreased ROM, decreased strength, hypomobility, impaired flexibility, impaired UE functional use, improper body mechanics, postural dysfunction, and pain.    ACTIVITY LIMITATIONS: carrying, lifting, transfers, bed mobility, bathing, dressing, and reach over head   PARTICIPATION LIMITATIONS: meal prep, cleaning, laundry, driving, shopping, community activity, and yard work   PERSONAL  FACTORS: 3+ comorbidities: See medical hx  are also affecting patient's functional outcome.       GOALS: Goals reviewed with patient? Yes   SHORT TERM GOALS: Target date: 08/23/2022    Pt will report understanding and adherence to initial HEP in order to promote independence in the management of primary impairments. Baseline: HEP provided at eval Goal status: MET Pt reports adherence 08/25/22       LONG TERM GOALS: Target date: 09/20/2022     Pt will achieve a FOTO score of 67% in order to demonstrate improved functional ability as it relates to the pt's primary impairments. Baseline: 51% Goal status: Progressing 09/06/22: 64%   2.  Pt will achieve Rt shoulder flexion and abduction AROM of 140 degrees in order to reach into overhead cabinets with less limitation. Baseline: See AROM chart Goal status: INITIAL   3.  Pt will achieve Rt shoulder ER and IR AROM of 60 degrees in order to get dressed with less limitation. Baseline: See AROM chart Goal status: INITIAL   4.  Pt will achieve global Rt shoulder MMT of 4/5 in order to return to working on his car with less limitation. Baseline: Not tested due to post-op status Goal  status: INITIAL   5.  Pt will achieve 10 push-ups with 0-3/10 pain in order to return to his pre-surgery workout level with less limitation. Baseline: Unable due to post-op status Goal status: INITIAL     PLAN:   PT FREQUENCY: 2x/week   PT DURATION: 8 weeks   PLANNED INTERVENTIONS: Therapeutic exercises, Therapeutic activity, Neuromuscular re-education, Patient/Family education, Self Care, Joint mobilization, Dry Needling, Electrical stimulation, Spinal manipulation, Spinal mobilization, Cryotherapy, Moist heat, Taping, Vasopneumatic device, Biofeedback, Ionotophoresis 23m/ml Dexamethasone, Manual therapy, and Re-evaluation   PLAN FOR NEXT SESSION: Progress early ROM, progress to strengthening in accordance with Dr. DLondell MohRTC repair protocol in lieu of pt-provided protocol, progress note?   SMargarette CanadaPTA 09/08/22 11:25 AM

## 2022-09-08 ENCOUNTER — Ambulatory Visit: Payer: No Typology Code available for payment source

## 2022-09-08 DIAGNOSIS — G8929 Other chronic pain: Secondary | ICD-10-CM

## 2022-09-08 DIAGNOSIS — M25511 Pain in right shoulder: Secondary | ICD-10-CM | POA: Diagnosis not present

## 2022-09-08 DIAGNOSIS — M6281 Muscle weakness (generalized): Secondary | ICD-10-CM

## 2022-09-13 ENCOUNTER — Ambulatory Visit: Payer: No Typology Code available for payment source

## 2022-09-13 DIAGNOSIS — M6281 Muscle weakness (generalized): Secondary | ICD-10-CM

## 2022-09-13 DIAGNOSIS — M25511 Pain in right shoulder: Secondary | ICD-10-CM | POA: Diagnosis not present

## 2022-09-13 DIAGNOSIS — G8929 Other chronic pain: Secondary | ICD-10-CM

## 2022-09-13 NOTE — Therapy (Signed)
OUTPATIENT PHYSICAL THERAPY TREATMENT NOTE   Patient Name: William Meyer MRN: [id number] DOB:[birth year], 76 y.o., male Today's Date: 09/13/2022  PCP: [facility]  REFERRING PROVIDER: Gareth Morgan, MD   END OF SESSION:   PT End of Session - 09/13/22 1028     Visit Number 12    Number of Visits 17    Date for PT Re-Evaluation 09/27/22    Authorization Type [place]    Authorization Time Period FOTO v6, v10; 15 visits 9/26-1/24/24  b    Authorization - Visit Number 11    Authorization - Number of Visits 15    PT Start Time 1032    PT Stop Time 1125   10 minutes vasopneumatic treatment   PT Time Calculation (min) 53 min    Activity Tolerance Patient tolerated treatment well    Behavior During Therapy WFL for tasks assessed/performed                Past Medical History:  Diagnosis Date   Anxiety    during the night- wakes up with hot flashes because of panic attacks    Arthritis    degeneration of spine    Heart murmur    minimal- - pt. told that it was detected on his exit - [place]- from [employer] service    High cholesterol    History of Doppler ultrasound    recent doppler studies of lower extremities due to pain.  Pt. told that the results were normal.    Hypertension    Neuromuscular disorder ([place])    neuropathy, tremors in R hand at times     Past Surgical History:  Procedure Laterality Date   BACK SURGERY     x2 - lumbar surgeries, cervical fusion x1   EYE SURGERY     L cataract /w IOL   HERNIA REPAIR     R side- inguinal    KNEE ARTHROSCOPY Left    LUMBAR LAMINECTOMY/DECOMPRESSION MICRODISCECTOMY N/A 07/30/2014   Procedure: LUMBAR LAMINECTOMY/DECOMPRESSION MICRODISCECTOMY. LUMBAR TWO/THREE, THREE/FOUR, FOUR/FIVE, FIVE/SACRAL ONE;  Surgeon: Newman Pies, MD;  Location: [place] [facility];  Service: Neurosurgery;  Laterality: N/A;   Patient Active Problem List   Diagnosis Date Noted   Lumbar stenosis with neurogenic claudication 07/30/2014     REFERRING DIAG: s/p right rotator cuff repair   THERAPY DIAG:  Chronic right shoulder pain  Muscle weakness (generalized)  Rationale for Evaluation and Treatment Rehabilitation  PERTINENT HISTORY: HTN, heart murmur, anxiety, hx of neuropathy in Rt hand, hx of two lumbar surgeries and one cervical fusion, Rotator cuff repair with biceps tenodesis on 06/13/2022   PRECAUTIONS: Shoulder   SUBJECTIVE:  SUBJECTIVE STATEMENT: Pt reports 5-6/10 Rt shoulder pain today. He reports a pattern of improved pain since starting PT. He also reports adherence to his HEP.  PAIN:  Are you having pain? Yes: NPRS scale: 5-6/10 Pain location: Rt shoulder Pain description: sharp, pins and needles Aggravating factors: Reaching overhead Relieving factors: rest, wearing sling   OBJECTIVE: (objective measures completed at initial evaluation unless otherwise dated)   DIAGNOSTIC FINDINGS:  None recent available in EPIC   PATIENT SURVEYS:  FOTO 51%, predicted 67% in 15 visits 09/06/22: 64%   COGNITION: Overall cognitive status: Within functional limits for tasks assessed                                  SENSATION: Not tested   POSTURE: Forward shoulders/ head   UPPER EXTREMITY ROM:    A/PROM Right eval Left eval R 11/28 R 09/06/22  Shoulder flexion 70p! PROM 160/165 130 P! 128/132  Shoulder abduction 75p! PROM 180/185  90/110  Shoulder internal rotation 40p! PROM 90/95  85  Shoulder external rotation 55p! PROM 80/85  40p!  (Blank rows = not tested)   UPPER EXTREMITY MMT:   MMT Right eval Left eval Right 09/13/2022 Left 09/13/2022  Shoulder flexion NT 5/5 4/5p!   Shoulder extension NT 5/5    Shoulder abduction NT 5/5 5/5   Shoulder internal rotation NT 5/5 5/5   Shoulder external rotation NT 5/5  4+/5p!   Middle trapezius NT 4/5 3+/5 4/5  Lower trapezius NT 3+/5 2/5p! 3/5  Latissimus dorsi NT 5/5 5/5 5/5  Elbow flexion 5/5 5/5    Elbow extension 5/5 5/5    Grip strength (lbs) 88 86    (Blank rows = not tested)     JOINT MOBILITY TESTING:  Deferred due to post-op status   PALPATION:  No TTP about global Rt shoulder             TODAY'S TREATMENT:   [facility] Adult PT Treatment:                                                DATE: 09/13/2022 Therapeutic Exercise: Standing BIL shoulder flexion AAROM with black physioball at wall with PT pertrubations at end range 3x30 seconds Standing alternating "V" lifts with black physioball 3x10 Standing Rt shoulder scaption stepwise lifts with 2# dumbbell from countertop to shest-level shelf, to eye-level shelf, to overhead shelf, and back 3x8 Bent-over Rt shoulder pendulums with 2# dumbbell 3x30sec Standing Rt shoulder ER with red band 3x12 Standing BIL biceps pull-downs with 13# cables 3x10 Manual Therapy: N/A Neuromuscular re-ed: N/A Therapeutic Activity: N/A Modalities: 10 minutes of GameReady vasopneumatic treatment at 34d F low compression to Rt shoulder in sitting with no adverse response Self Care: N/A    [facility] Adult PT Treatment:                                                DATE: 09/08/2022 Therapeutic Exercise: UBE level 1 x 4' fwd and 1' bwd (not painful today) Supine Rt chest press - 1000g ball 3x10 S/L ER - 3x10 500g ball Sidelying abduction 500g ball 2x10 Supine circles at 90  flex 500g ball x15 CW/CCW Supine flexion AROM with 1000g ball x10, 500g ball x10 Seated shoulder flexion AROM with tennis ball 3x10 (cues to decrease compensation) Seated scaption AROM with tennis ball 2x10 Rows RTB 3x10 Shoulder extension RTB 3x10 BIL shoulder flexion and ER in pillowcase sliding up wall 3x10 Modalities: 10 minutes of GameReady vasopneumatic treatment at 34d F low compression to Rt shoulder in sitting with no adverse  response  [facility] Adult PT Treatment:                                                DATE: 09/06/2022 Therapeutic Exercise: UBE level 1 4' fwd (bwd painful) Supine Rt chest press - 500g ball 3x10 S/L ER - 3x10 500g ball Supine circles at 90 flex 500g ball x10 CW/CCW Supine flexion AROM with 500g ball 3x10 Seated shoulder flexion AROM with tennis ball 2x10 (cues to decrease compensation) Seated scaption AROM with tennis ball 2x10 Rows RTB 3x10 Shoulder extension YTB 3x10 Manual Therapy: Supine Rt shoulder flexion/ER/abduction PROM with gentle distraction/ vibrations for pain modulation Therapeutic Activity: Re-administration of FOTO ROM measurements Modalities: 10 minutes of GameReady vasopneumatic treatment at 34d F low compression to Rt shoulder in sitting with no adverse response                 PATIENT EDUCATION: Education details: Pt educated on prognosis, POC, FOTO, and HEP Person educated: Patient Education method: Consulting civil engineer, Media planner, and Handouts Education comprehension: verbalized understanding and returned demonstration   HOME EXERCISE PROGRAM: Access Code: [email or web address] URL: [email or web address] Date: 08/16/2022 Prepared by: Shearon Balo  Exercises - Shoulder Scaption AAROM with Dowel  - 1 x daily - 7 x weekly - 3 sets - 10 reps - 5 seconds hold - Standing Shoulder External Rotation AAROM with Dowel (Mirrored)  - 1 x daily - 7 x weekly - 3 sets - 10 reps - 5 seconds hold - Seated Scapular Retraction  - 1 x daily - 7 x weekly - 3 sets - 10 reps - 5 seconds hold - Sidelying Shoulder External Rotation  - 1 x daily - 7 x weekly - 3 sets - 10 reps   ASSESSMENT:   CLINICAL IMPRESSION: Pt responded well to all interventions today, demonstrating good form and mild increases in pain throughout the session. Pain returned to baseline at end of treatment. Upon assessment of Rt shoulder strength today, the pt has pain and limitation in Rt shoulder ER and  flexion MMT, as well as BIL lower traps and mid traps. He demonstrated visually improved shoulder overhead ROM following exercises today and will continue to benefit from skilled PT to address his primary impairments and return to his prior level of function with less limitation.   OBJECTIVE IMPAIRMENTS: decreased endurance, decreased knowledge of condition, decreased mobility, decreased ROM, decreased strength, hypomobility, impaired flexibility, impaired UE functional use, improper body mechanics, postural dysfunction, and pain.    ACTIVITY LIMITATIONS: carrying, lifting, transfers, bed mobility, bathing, dressing, and reach over head   PARTICIPATION LIMITATIONS: meal prep, cleaning, laundry, driving, shopping, community activity, and yard work   PERSONAL FACTORS: 3+ comorbidities: See medical hx  are also affecting patient's functional outcome.       GOALS: Goals reviewed with patient? Yes   SHORT TERM GOALS: Target date: 08/23/2022    Pt will report understanding and adherence to initial HEP in  order to promote independence in the management of primary impairments. Baseline: HEP provided at eval Goal status: MET Pt reports adherence 08/25/22       LONG TERM GOALS: Target date: 09/20/2022     Pt will achieve a FOTO score of 67% in order to demonstrate improved functional ability as it relates to the pt's primary impairments. Baseline: 51% Goal status: Progressing 09/06/22: 64%   2.  Pt will achieve Rt shoulder flexion and abduction AROM of 140 degrees in order to reach into overhead cabinets with less limitation. Baseline: See AROM chart Goal status: INITIAL   3.  Pt will achieve Rt shoulder ER and IR AROM of 60 degrees in order to get dressed with less limitation. Baseline: See AROM chart Goal status: INITIAL   4.  Pt will achieve global Rt shoulder MMT of 4/5 in order to return to working on his car with less limitation. Baseline: Not tested due to post-op status Goal  status: INITIAL   5.  Pt will achieve 10 push-ups with 0-3/10 pain in order to return to his pre-surgery workout level with less limitation. Baseline: Unable due to post-op status Goal status: INITIAL     PLAN:   PT FREQUENCY: 2x/week   PT DURATION: 8 weeks   PLANNED INTERVENTIONS: Therapeutic exercises, Therapeutic activity, Neuromuscular re-education, Patient/Family education, Self Care, Joint mobilization, Dry Needling, Electrical stimulation, Spinal manipulation, Spinal mobilization, Cryotherapy, Moist heat, Taping, Vasopneumatic device, Biofeedback, Ionotophoresis 20m/ml Dexamethasone, Manual therapy, and Re-evaluation   PLAN FOR NEXT SESSION: Progress early ROM, progress to strengthening in accordance with Dr. DLondell MohRTC repair protocol in lieu of pt-provided protocol   YVanessa Manzanita PT, DPT 09/13/22 11:37 AM

## 2022-09-15 ENCOUNTER — Ambulatory Visit: Payer: No Typology Code available for payment source

## 2022-09-15 DIAGNOSIS — M25511 Pain in right shoulder: Secondary | ICD-10-CM | POA: Diagnosis not present

## 2022-09-15 DIAGNOSIS — M6281 Muscle weakness (generalized): Secondary | ICD-10-CM

## 2022-09-15 DIAGNOSIS — G8929 Other chronic pain: Secondary | ICD-10-CM

## 2022-09-15 NOTE — Therapy (Signed)
OUTPATIENT PHYSICAL THERAPY TREATMENT NOTE   Patient Name: William Meyer MRN: [id number] DOB:[birth year], 76 y.o., male Today's Date: 09/15/2022  PCP: [facility]  REFERRING PROVIDER: Gareth Morgan, MD   END OF SESSION:   PT End of Session - 09/15/22 1120     Visit Number 13    Number of Visits 17    Date for PT Re-Evaluation 09/27/22    Authorization Type [place]    Authorization Time Period FOTO v6, v10; 15 visits 9/26-1/24/24  b    Authorization - Visit Number 12    Authorization - Number of Visits 15    PT Start Time 0737    PT Stop Time 1214   10 minutes vasopneumatic treatment   PT Time Calculation (min) 50 min    Activity Tolerance Patient tolerated treatment well    Behavior During Therapy WFL for tasks assessed/performed                 Past Medical History:  Diagnosis Date   Anxiety    during the night- wakes up with hot flashes because of panic attacks    Arthritis    degeneration of spine    Heart murmur    minimal- - pt. told that it was detected on his exit - [place]- from [employer] service    High cholesterol    History of Doppler ultrasound    recent doppler studies of lower extremities due to pain.  Pt. told that the results were normal.    Hypertension    Neuromuscular disorder ([place])    neuropathy, tremors in R hand at times     Past Surgical History:  Procedure Laterality Date   BACK SURGERY     x2 - lumbar surgeries, cervical fusion x1   EYE SURGERY     L cataract /w IOL   HERNIA REPAIR     R side- inguinal    KNEE ARTHROSCOPY Left    LUMBAR LAMINECTOMY/DECOMPRESSION MICRODISCECTOMY N/A 07/30/2014   Procedure: LUMBAR LAMINECTOMY/DECOMPRESSION MICRODISCECTOMY. LUMBAR TWO/THREE, THREE/FOUR, FOUR/FIVE, FIVE/SACRAL ONE;  Surgeon: Newman Pies, MD;  Location: [place] [facility];  Service: Neurosurgery;  Laterality: N/A;   Patient Active Problem List   Diagnosis Date Noted   Lumbar stenosis with neurogenic claudication 07/30/2014     REFERRING DIAG: s/p right rotator cuff repair   THERAPY DIAG:  Chronic right shoulder pain  Muscle weakness (generalized)  Rationale for Evaluation and Treatment Rehabilitation  PERTINENT HISTORY: HTN, heart murmur, anxiety, hx of neuropathy in Rt hand, hx of two lumbar surgeries and one cervical fusion, Rotator cuff repair with biceps tenodesis on 06/13/2022   PRECAUTIONS: Shoulder   SUBJECTIVE:  SUBJECTIVE STATEMENT: Pt reports feeling well since his last visit, adding that he only had slight residual soreness that night.   PAIN:  Are you having pain? Yes: NPRS scale: 7/10 Pain location: Rt shoulder Pain description: sharp, pins and needles Aggravating factors: Reaching overhead Relieving factors: rest, wearing sling   OBJECTIVE: (objective measures completed at initial evaluation unless otherwise dated)   DIAGNOSTIC FINDINGS:  None recent available in EPIC   PATIENT SURVEYS:  FOTO 51%, predicted 67% in 15 visits 09/06/22: 64%   COGNITION: Overall cognitive status: Within functional limits for tasks assessed                                  SENSATION: Not tested   POSTURE: Forward shoulders/ head   UPPER EXTREMITY ROM:    A/PROM Right eval Left eval R 11/28 R 09/06/22  Shoulder flexion 70p! PROM 160/165 130 P! 128/132  Shoulder abduction 75p! PROM 180/185  90/110  Shoulder internal rotation 40p! PROM 90/95  85  Shoulder external rotation 55p! PROM 80/85  40p!  (Blank rows = not tested)   UPPER EXTREMITY MMT:   MMT Right eval Left eval Right 09/13/2022 Left 09/13/2022  Shoulder flexion NT 5/5 4/5p!   Shoulder extension NT 5/5    Shoulder abduction NT 5/5 5/5   Shoulder internal rotation NT 5/5 5/5   Shoulder external rotation NT 5/5 4+/5p!   Middle trapezius NT 4/5  3+/5 4/5  Lower trapezius NT 3+/5 2/5p! 3/5  Latissimus dorsi NT 5/5 5/5 5/5  Elbow flexion 5/5 5/5    Elbow extension 5/5 5/5    Grip strength (lbs) 88 86    (Blank rows = not tested)     JOINT MOBILITY TESTING:  Deferred due to post-op status   PALPATION:  No TTP about global Rt shoulder             TODAY'S TREATMENT:   [facility] Adult PT Treatment:                                                DATE: 09/15/2022 Therapeutic Exercise: UBE x2mn fwd, x216m backward while collecting subjective information Standing Rt shoulder flexion AAROM with UE Ranger with GTB perturbations to each side 3x20 sec each side Bent-over Rt shoulder pendulums 3x30sec Standing BIL shoulder ER with combined scapular retraction with two 7# cables 3x10 Standing BIL shoulder scaption lifts with 2# dumbbells 3x8 Seated low rows with 35# cable 2x10 Seated high rows with 35# cable 2x10 Seated shoulder rolls 2x10 forward and backward Manual Therapy: N/A Neuromuscular re-ed: N/A Therapeutic Activity: N/A Modalities: 10 minutes of GameReady vasopneumatic treatment at 34d F low compression to Rt shoulder in sitting with no adverse response Self Care: N/A   [facility]                                                [facility]: [facility] Therapeutic Exercise: Standing BIL shoulder flexion AAROM with black physioball at wall with PT pertrubations at end range 3x30 seconds Standing alternating "V" lifts with black physioball 3x10 Standing Rt shoulder scaption stepwise lifts with 2# dumbbell from countertop to shest-level  shelf, to eye-level shelf, to overhead shelf, and back 3x8 Bent-over Rt shoulder pendulums with 2# dumbbell 3x30sec Standing Rt shoulder ER with red band 3x12 Standing BIL biceps pull-downs with 13# cables 3x10 Manual Therapy: N/A Neuromuscular re-ed: N/A Therapeutic Activity: N/A Modalities: 10 minutes of GameReady vasopneumatic treatment at 34d F low compression to Rt shoulder  in sitting with no adverse response Self Care: N/A    [facility] Adult PT Treatment:                                                DATE: 09/08/2022 Therapeutic Exercise: UBE level 1 x 4' fwd and 1' bwd (not painful today) Supine Rt chest press - 1000g ball 3x10 S/L ER - 3x10 500g ball Sidelying abduction 500g ball 2x10 Supine circles at 90 flex 500g ball x15 CW/CCW Supine flexion AROM with 1000g ball x10, 500g ball x10 Seated shoulder flexion AROM with tennis ball 3x10 (cues to decrease compensation) Seated scaption AROM with tennis ball 2x10 Rows RTB 3x10 Shoulder extension RTB 3x10 BIL shoulder flexion and ER in pillowcase sliding up wall 3x10 Modalities: 10 minutes of GameReady vasopneumatic treatment at 34d F low compression to Rt shoulder in sitting with no adverse response    PATIENT EDUCATION: Education details: Pt educated on prognosis, POC, FOTO, and HEP Person educated: Patient Education method: Consulting civil engineer, Media planner, and Handouts Education comprehension: verbalized understanding and returned demonstration   HOME EXERCISE PROGRAM: Access Code: [email or web address] URL: [email or web address] Date: 08/16/2022 Prepared by: Shearon Balo  Exercises - Shoulder Scaption AAROM with Dowel  - 1 x daily - 7 x weekly - 3 sets - 10 reps - 5 seconds hold - Standing Shoulder External Rotation AAROM with Dowel (Mirrored)  - 1 x daily - 7 x weekly - 3 sets - 10 reps - 5 seconds hold - Seated Scapular Retraction  - 1 x daily - 7 x weekly - 3 sets - 10 reps - 5 seconds hold - Sidelying Shoulder External Rotation  - 1 x daily - 7 x weekly - 3 sets - 10 reps   ASSESSMENT:   CLINICAL IMPRESSION: Pt continues to respond well to progressed RTC strengthening/ shoulder stabilization at end-range overhead motion. He will continue to benefit from skilled PT to address his primary impairments and return to his prior level of function with less limitation.    OBJECTIVE IMPAIRMENTS:  decreased endurance, decreased knowledge of condition, decreased mobility, decreased ROM, decreased strength, hypomobility, impaired flexibility, impaired UE functional use, improper body mechanics, postural dysfunction, and pain.    ACTIVITY LIMITATIONS: carrying, lifting, transfers, bed mobility, bathing, dressing, and reach over head   PARTICIPATION LIMITATIONS: meal prep, cleaning, laundry, driving, shopping, community activity, and yard work   PERSONAL FACTORS: 3+ comorbidities: See medical hx  are also affecting patient's functional outcome.       GOALS: Goals reviewed with patient? Yes   SHORT TERM GOALS: Target date: 08/23/2022    Pt will report understanding and adherence to initial HEP in order to promote independence in the management of primary impairments. Baseline: HEP provided at eval Goal status: MET Pt reports adherence 08/25/22       LONG TERM GOALS: Target date: 09/20/2022     Pt will achieve a FOTO score of 67% in order to demonstrate improved functional ability as it relates to the pt's primary impairments. Baseline: 51%  Goal status: Progressing 09/06/22: 64%   2.  Pt will achieve Rt shoulder flexion and abduction AROM of 140 degrees in order to reach into overhead cabinets with less limitation. Baseline: See AROM chart Goal status: INITIAL   3.  Pt will achieve Rt shoulder ER and IR AROM of 60 degrees in order to get dressed with less limitation. Baseline: See AROM chart Goal status: INITIAL   4.  Pt will achieve global Rt shoulder MMT of 4/5 in order to return to working on his car with less limitation. Baseline: Not tested due to post-op status 09/13/2022: See updated MMT chart Goal status: ACHIEVED   5.  Pt will achieve 10 push-ups with 0-3/10 pain in order to return to his pre-surgery workout level with less limitation. Baseline: Unable due to post-op status Goal status: INITIAL     PLAN:   PT FREQUENCY: 2x/week   PT DURATION: 8 weeks    PLANNED INTERVENTIONS: Therapeutic exercises, Therapeutic activity, Neuromuscular re-education, Patient/Family education, Self Care, Joint mobilization, Dry Needling, Electrical stimulation, Spinal manipulation, Spinal mobilization, Cryotherapy, Moist heat, Taping, Vasopneumatic device, Biofeedback, Ionotophoresis 55m/ml Dexamethasone, Manual therapy, and Re-evaluation   PLAN FOR NEXT SESSION: Progress early ROM, progress to strengthening in accordance with Dr. DLondell MohRTC repair protocol in lieu of pt-provided protocol   YVanessa [place] PT, DPT 09/15/22 12:57 PM

## 2022-09-20 ENCOUNTER — Ambulatory Visit: Payer: No Typology Code available for payment source | Attending: Orthopedic Surgery

## 2022-09-20 DIAGNOSIS — M6281 Muscle weakness (generalized): Secondary | ICD-10-CM | POA: Insufficient documentation

## 2022-09-20 DIAGNOSIS — G8929 Other chronic pain: Secondary | ICD-10-CM | POA: Diagnosis not present

## 2022-09-20 DIAGNOSIS — M25511 Pain in right shoulder: Secondary | ICD-10-CM | POA: Diagnosis present

## 2022-09-20 NOTE — Therapy (Signed)
OUTPATIENT PHYSICAL THERAPY TREATMENT NOTE   Patient Name: William Meyer MRN: [id number] DOB:10/15/1945, 77 y.o., male Today's Date: 09/20/2022  PCP: [facility]  REFERRING PROVIDER: Gareth Morgan, MD   END OF SESSION:   PT End of Session - 09/20/22 1145     Visit Number 14    Number of Visits 22    Date for PT Re-Evaluation 11/22/22    Authorization Type [place]    Authorization Time Period FOTO v6, v10; 15 visits 9/26-1/24/24    Authorization - Visit Number 13    Authorization - Number of Visits 15    PT Start Time 1324    PT Stop Time 1221   10 minutes vasopneumatic treatment   PT Time Calculation (min) 50 min    Activity Tolerance Patient tolerated treatment well    Behavior During Therapy WFL for tasks assessed/performed                  Past Medical History:  Diagnosis Date   Anxiety    during the night- wakes up with hot flashes because of panic attacks    Arthritis    degeneration of spine    Heart murmur    minimal- - pt. told that it was detected on his exit - [place]- from [employer] service    High cholesterol    History of Doppler ultrasound    recent doppler studies of lower extremities due to pain.  Pt. told that the results were normal.    Hypertension    Neuromuscular disorder ([place])    neuropathy, tremors in R hand at times     Past Surgical History:  Procedure Laterality Date   BACK SURGERY     x2 - lumbar surgeries, cervical fusion x1   EYE SURGERY     L cataract /w IOL   HERNIA REPAIR     R side- inguinal    KNEE ARTHROSCOPY Left    LUMBAR LAMINECTOMY/DECOMPRESSION MICRODISCECTOMY N/A 07/30/2014   Procedure: LUMBAR LAMINECTOMY/DECOMPRESSION MICRODISCECTOMY. LUMBAR TWO/THREE, THREE/FOUR, FOUR/FIVE, FIVE/SACRAL ONE;  Surgeon: Newman Pies, MD;  Location: [place] [facility];  Service: Neurosurgery;  Laterality: N/A;   Patient Active Problem List   Diagnosis Date Noted   Lumbar stenosis with neurogenic claudication 07/30/2014     REFERRING DIAG: s/p right rotator cuff repair   THERAPY DIAG:  Chronic right shoulder pain - Plan: PT plan of care cert/re-cert  Muscle weakness (generalized) - Plan: PT plan of care cert/re-cert  Rationale for Evaluation and Treatment Rehabilitation  PERTINENT HISTORY: HTN, heart murmur, anxiety, hx of neuropathy in Rt hand, hx of two lumbar surgeries and one cervical fusion, Rotator cuff repair with biceps tenodesis on 06/13/2022   PRECAUTIONS: Shoulder   SUBJECTIVE:  SUBJECTIVE STATEMENT: Pt reports he is about 75%-80% better, although he reports occasional pain when tying his shoes or turning his head away from his Rt shoulder. He reports HEP adherence.  PAIN:  Are you having pain? Yes: NPRS scale: 7/10 Pain location: Rt shoulder Pain description: sharp, pins and needles Aggravating factors: Reaching overhead Relieving factors: rest, wearing sling   OBJECTIVE: (objective measures completed at initial evaluation unless otherwise dated)   DIAGNOSTIC FINDINGS:  None recent available in EPIC   PATIENT SURVEYS:  FOTO 51%, predicted 67% in 15 visits 09/06/22: 64%   COGNITION: Overall cognitive status: Within functional limits for tasks assessed                                  SENSATION: Not tested   POSTURE: Forward shoulders/ head   UPPER EXTREMITY ROM:    A/PROM Right eval Left eval R 11/28 R 09/06/22 Right 09/20/2022  Shoulder flexion 70p! PROM 160/165 130 P! 128/132 139 p!  Shoulder abduction 75p! PROM 180/185  90/110 135, tight  Shoulder internal rotation 40p! PROM 90/95  85 34/44  Shoulder external rotation 55p! PROM 80/85  40p! 70/75p!  (Blank rows = not tested)   UPPER EXTREMITY MMT:   MMT Right eval Left eval Right 09/13/2022 Left 09/13/2022 Right 09/20/2022   Shoulder flexion NT 5/5 4/5p!  5/5  Shoulder extension NT 5/5     Shoulder abduction NT 5/5 5/5  5/5  Shoulder internal rotation NT 5/5 5/5  5/5  Shoulder external rotation NT 5/5 4+/5p!  5/5  Middle trapezius NT 4/5 3+/5 4/5   Lower trapezius NT 3+/5 2/5p! 3/5   Latissimus dorsi NT 5/5 5/5 5/5   Elbow flexion 5/5 5/5     Elbow extension 5/5 5/5     Grip strength (lbs) 88 86     (Blank rows = not tested)     JOINT MOBILITY TESTING:  Deferred due to post-op status   PALPATION:  No TTP about global Rt shoulder             TODAY'S TREATMENT:   [facility] Adult PT Treatment:                                                DATE: 09/20/2021 Therapeutic Exercise: Standing Rt shoulder flexion AAROM with UE Ranger with short-range horizontal adduction/ abduction at end-range 2x10 Standing Rt shoulder scaption AAROM with UE Ranger 2x10 with 5-sec hold at end-range Manual Therapy: N/A Neuromuscular re-ed: N/A Therapeutic Activity: Re-assessment of objective measures with pt education Standing Rt shoulder scaption stepwise lifts with 3# dumbbell from countertop to shest-level shelf, to eye-level shelf, to overhead shelf, and back 3x8 Modalities: 10 minutes of GameReady vasopneumatic treatment at 34d F low compression to Rt shoulder in sitting with no adverse response Self Care: N/A   [facility] Adult PT Treatment:                                                DATE: 09/15/2022 Therapeutic Exercise: UBE x3mn fwd, x279m backward while collecting subjective information Standing Rt shoulder flexion AAROM with UE Ranger with GTB perturbations to each side 3x20  sec each side Bent-over Rt shoulder pendulums 3x30sec Standing BIL shoulder ER with combined scapular retraction with two 7# cables 3x10 Standing BIL shoulder scaption lifts with 2# dumbbells 3x8 Seated low rows with 35# cable 2x10 Seated high rows with 35# cable 2x10 Seated shoulder rolls 2x10 forward and backward Manual  Therapy: N/A Neuromuscular re-ed: N/A Therapeutic Activity: N/A Modalities: 10 minutes of GameReady vasopneumatic treatment at 34d F low compression to Rt shoulder in sitting with no adverse response Self Care: N/A   [facility] Adult PT Treatment:                                                DATE: 09/13/2022 Therapeutic Exercise: Standing BIL shoulder flexion AAROM with black physioball at wall with PT pertrubations at end range 3x30 seconds Standing alternating "V" lifts with black physioball 3x10 Standing Rt shoulder scaption stepwise lifts with 2# dumbbell from countertop to shest-level shelf, to eye-level shelf, to overhead shelf, and back 3x8 Bent-over Rt shoulder pendulums with 2# dumbbell 3x30sec Standing Rt shoulder ER with red band 3x12 Standing BIL biceps pull-downs with 13# cables 3x10 Manual Therapy: N/A Neuromuscular re-ed: N/A Therapeutic Activity: N/A Modalities: 10 minutes of GameReady vasopneumatic treatment at 34d F low compression to Rt shoulder in sitting with no adverse response Self Care: N/A     PATIENT EDUCATION: Education details: Pt educated on prognosis, POC, FOTO, and HEP Person educated: Patient Education method: Explanation, Demonstration, and Handouts Education comprehension: verbalized understanding and returned demonstration   HOME EXERCISE PROGRAM: Access Code: [email or web address] URL: [email or web address] Date: 08/16/2022 Prepared by: Karl Reinhartsen  Exercises - Shoulder Scaption AAROM with Dowel  - 1 x daily - 7 x weekly - 3 sets - 10 reps - 5 seconds hold - Standing Shoulder External Rotation AAROM with Dowel (Mirrored)  - 1 x daily - 7 x weekly - 3 sets - 10 reps - 5 seconds hold - Seated Scapular Retraction  - 1 x daily - 7 x weekly - 3 sets - 10 reps - 5 seconds hold - Sidelying Shoulder External Rotation  - 1 x daily - 7 x weekly - 3 sets - 10 reps   ASSESSMENT:   CLINICAL IMPRESSION: Pt responded well to all  interventions today, demonstrating good form and no increase in pain with selected exercises. Upon re-assessment of objective measures, the pt has made good progress in his shoulder elevation ROM since last assessment, as well as global Rt shoulder strength. However, he has still not met most of his functional rehab goals and will benefit from continued skilled PT to address his remaining symptoms and return to his prior level of function with less limitation.   OBJECTIVE IMPAIRMENTS: decreased endurance, decreased knowledge of condition, decreased mobility, decreased ROM, decreased strength, hypomobility, impaired flexibility, impaired UE functional use, improper body mechanics, postural dysfunction, and pain.    ACTIVITY LIMITATIONS: carrying, lifting, transfers, bed mobility, bathing, dressing, and reach over head   PARTICIPATION LIMITATIONS: meal prep, cleaning, laundry, driving, shopping, community activity, and yard work   PERSONAL FACTORS: 3+ comorbidities: See medical hx  are also affecting patient's functional outcome.       GOALS: Goals reviewed with patient? Yes   SHORT TERM GOALS: Target date: 08/23/2022    Pt will report understanding and adherence to initial HEP in order to promote independence in the management of primary   impairments. Baseline: HEP provided at eval Goal status: MET Pt reports adherence 08/25/22       LONG TERM GOALS: Target date: 09/20/2022     Pt will achieve a FOTO score of 67% in order to demonstrate improved functional ability as it relates to the pt's primary impairments. Baseline: 51% Goal status: Progressing 09/06/22: 64%   2.  Pt will achieve Rt shoulder flexion and abduction AROM of 140 degrees in order to reach into overhead cabinets with less limitation. Baseline: See AROM chart 09/20/2021: Abduction: 134, flexion: 139 Goal status: IN PROGRESS   3.  Pt will achieve Rt shoulder ER and IR AROM of 60 degrees in order to get dressed with less  limitation. Baseline: See AROM chart 09/20/2021: See updated AROM chart Goal status: IN PROGRESS   4.  Pt will achieve global Rt shoulder MMT of 4/5 in order to return to working on his car with less limitation. Baseline: Not tested due to post-op status 09/13/2022: See updated MMT chart Goal status: ACHIEVED   5.  Pt will achieve 10 push-ups with 0-3/10 pain in order to return to his pre-surgery workout level with less limitation. Baseline: Unable due to post-op status Goal status: INITIAL     PLAN:   PT FREQUENCY: 1x/week   PT DURATION: 8 weeks   PLANNED INTERVENTIONS: Therapeutic exercises, Therapeutic activity, Neuromuscular re-education, Patient/Family education, Self Care, Joint mobilization, Dry Needling, Electrical stimulation, Spinal manipulation, Spinal mobilization, Cryotherapy, Moist heat, Taping, Vasopneumatic device, Biofeedback, Ionotophoresis 69m/ml Dexamethasone, Manual therapy, and Re-evaluation   PLAN FOR NEXT SESSION: Progress early ROM, progress to strengthening in accordance with Dr. DLondell MohRTC repair protocol in lieu of pt-provided protocol   YVanessa [place] PT, DPT 09/20/22 12:22 PM

## 2022-09-29 ENCOUNTER — Ambulatory Visit: Payer: No Typology Code available for payment source

## 2022-10-04 ENCOUNTER — Ambulatory Visit: Payer: No Typology Code available for payment source

## 2022-10-04 DIAGNOSIS — G8929 Other chronic pain: Secondary | ICD-10-CM

## 2022-10-04 DIAGNOSIS — M25511 Pain in right shoulder: Secondary | ICD-10-CM | POA: Diagnosis not present

## 2022-10-04 DIAGNOSIS — M6281 Muscle weakness (generalized): Secondary | ICD-10-CM

## 2022-10-04 NOTE — Therapy (Signed)
OUTPATIENT PHYSICAL THERAPY TREATMENT NOTE   Patient Name: William Meyer MRN: [id number] DOB:12/30/1945, 77 y.o., male Today's Date: 10/04/2022  PCP: [facility]  REFERRING PROVIDER: Gareth Morgan, MD   END OF SESSION:   PT End of Session - 10/04/22 1126     Visit Number 15    Number of Visits 22    Date for PT Re-Evaluation 11/22/22    Authorization Type [place]    Authorization Time Period 09/28/2022-01/26/2023 (Re-auth 'd at start of year, no visit number available)    Authorization - Visit Number 1    PT Start Time 1130    PT Stop Time 1220   10 minutes vasopneumatic treatment   PT Time Calculation (min) 50 min    Activity Tolerance Patient tolerated treatment well    Behavior During Therapy WFL for tasks assessed/performed                   Past Medical History:  Diagnosis Date   Anxiety    during the night- wakes up with hot flashes because of panic attacks    Arthritis    degeneration of spine    Heart murmur    minimal- - pt. told that it was detected on his exit - [place]- from [employer] service    High cholesterol    History of Doppler ultrasound    recent doppler studies of lower extremities due to pain.  Pt. told that the results were normal.    Hypertension    Neuromuscular disorder ([place])    neuropathy, tremors in R hand at times     Past Surgical History:  Procedure Laterality Date   BACK SURGERY     x2 - lumbar surgeries, cervical fusion x1   EYE SURGERY     L cataract /w IOL   HERNIA REPAIR     R side- inguinal    KNEE ARTHROSCOPY Left    LUMBAR LAMINECTOMY/DECOMPRESSION MICRODISCECTOMY N/A 07/30/2014   Procedure: LUMBAR LAMINECTOMY/DECOMPRESSION MICRODISCECTOMY. LUMBAR TWO/THREE, THREE/FOUR, FOUR/FIVE, FIVE/SACRAL ONE;  Surgeon: Newman Pies, MD;  Location: [place] [facility];  Service: Neurosurgery;  Laterality: N/A;   Patient Active Problem List   Diagnosis Date Noted   Lumbar stenosis with neurogenic claudication 07/30/2014     REFERRING DIAG: s/p right rotator cuff repair   THERAPY DIAG:  Chronic right shoulder pain  Muscle weakness (generalized)  Rationale for Evaluation and Treatment Rehabilitation  PERTINENT HISTORY: HTN, heart murmur, anxiety, hx of neuropathy in Rt hand, hx of two lumbar surgeries and one cervical fusion, Rotator cuff repair with biceps tenodesis on 06/13/2022   PRECAUTIONS: Shoulder   SUBJECTIVE:  SUBJECTIVE STATEMENT: Pt reports he is feeling good today. He rates his pain as 7/10 today. He reports continued HEP adherence, adding that he can tell he is able to reach higher than before.   PAIN:  Are you having pain? Yes: NPRS scale: 7/10 Pain location: Rt shoulder Pain description: sharp, pins and needles Aggravating factors: Reaching overhead Relieving factors: rest, wearing sling   OBJECTIVE: (objective measures completed at initial evaluation unless otherwise dated)   DIAGNOSTIC FINDINGS:  None recent available in EPIC   PATIENT SURVEYS:  FOTO 51%, predicted 67% in 15 visits 09/06/22: 64%   COGNITION: Overall cognitive status: Within functional limits for tasks assessed                                  SENSATION: Not tested   POSTURE: Forward shoulders/ head   UPPER EXTREMITY ROM:    A/PROM Right eval Left eval R 11/28 R 09/06/22 Right 09/20/2022  Shoulder flexion 70p! PROM 160/165 130 P! 128/132 139 p!  Shoulder abduction 75p! PROM 180/185  90/110 135, tight  Shoulder internal rotation 40p! PROM 90/95  85 34/44  Shoulder external rotation 55p! PROM 80/85  40p! 70/75p!  (Blank rows = not tested)   UPPER EXTREMITY MMT:   MMT Right eval Left eval Right 09/13/2022 Left 09/13/2022 Right 09/20/2022  Shoulder flexion NT 5/5 4/5p!  5/5  Shoulder extension NT 5/5     Shoulder  abduction NT 5/5 5/5  5/5  Shoulder internal rotation NT 5/5 5/5  5/5  Shoulder external rotation NT 5/5 4+/5p!  5/5  Middle trapezius NT 4/5 3+/5 4/5   Lower trapezius NT 3+/5 2/5p! 3/5   Latissimus dorsi NT 5/5 5/5 5/5   Elbow flexion 5/5 5/5     Elbow extension 5/5 5/5     Grip strength (lbs) 88 86     (Blank rows = not tested)     JOINT MOBILITY TESTING:  Deferred due to post-op status   PALPATION:  No TTP about global Rt shoulder             TODAY'S TREATMENT:   [facility] Adult PT Treatment:                                                DATE: 10/04/2022 Therapeutic Exercise: UBE forward/ 2 min backward while collecting subjective information Seated arm circle angels 2x5 Standing Rt shoulder scaption AAROM with UE Ranger with small-range horizontal abduction/ adduction at end range 2x10  Standing Rt shoulder stepwise scaption lifts with 4# dumbbells from counter to chest-level shelf, to eye-level shelf, to overhead shelf, and back again 2x8 Bent-over Rt shoulder pendulums with 4# dumbbell 3x30sec Standing combined low row and shoulder ER with two 7# cables 2x10 Standing biceps pull-downs with 23# cables 2x10 Manual Therapy: Lt sidelying Rt scapular protraction/upward rotation and retraction/ downward rotation contract/ relax MET x5 with 30 second hold at end range  Lt sidelying STM to Rt parascapular musculature x4 minutes Standing BIL shoulder ER with 7# cables 2x10 Neuromuscular re-ed: N/A Therapeutic Activity: N/A Modalities: 10 minutes of GameReady vasopneumatic treatment at 34d F low compression to Rt shoulder in sitting with no adverse response Self Care: N/A   [facility] Adult PT Treatment:  DATE: 09/20/2021 Therapeutic Exercise: Standing Rt shoulder flexion AAROM with UE Ranger with short-range horizontal adduction/ abduction at end-range 2x10 Standing Rt shoulder scaption AAROM with UE Ranger 2x10 with 5-sec hold at  end-range Manual Therapy: N/A Neuromuscular re-ed: N/A Therapeutic Activity: Re-assessment of objective measures with pt education Standing Rt shoulder scaption stepwise lifts with 3# dumbbell from countertop to shest-level shelf, to eye-level shelf, to overhead shelf, and back 3x8 Modalities: 10 minutes of GameReady vasopneumatic treatment at 34d F low compression to Rt shoulder in sitting with no adverse response Self Care: N/A   [facility] Adult PT Treatment:                                                DATE: 09/15/2022 Therapeutic Exercise: UBE x53min fwd, x52min backward while collecting subjective information Standing Rt shoulder flexion AAROM with UE Ranger with GTB perturbations to each side 3x20 sec each side Bent-over Rt shoulder pendulums 3x30sec Standing BIL shoulder ER with combined scapular retraction with two 7# cables 3x10 Standing BIL shoulder scaption lifts with 2# dumbbells 3x8 Seated low rows with 35# cable 2x10 Seated high rows with 35# cable 2x10 Seated shoulder rolls 2x10 forward and backward Manual Therapy: N/A Neuromuscular re-ed: N/A Therapeutic Activity: N/A Modalities: 10 minutes of GameReady vasopneumatic treatment at 34d F low compression to Rt shoulder in sitting with no adverse response Self Care: N/A    PATIENT EDUCATION: Education details: Pt educated on prognosis, POC, FOTO, and HEP Person educated: Patient Education method: Consulting civil engineer, Media planner, and Handouts Education comprehension: verbalized understanding and returned demonstration   HOME EXERCISE PROGRAM: Access Code: [email or web address] URL: [email or web address] Date: 08/16/2022 Prepared by: Shearon Balo  Exercises - Shoulder Scaption AAROM with Dowel  - 1 x daily - 7 x weekly - 3 sets - 10 reps - 5 seconds hold - Standing Shoulder External Rotation AAROM with Dowel (Mirrored)  - 1 x daily - 7 x weekly - 3 sets - 10 reps - 5 seconds hold - Seated Scapular Retraction  -  1 x daily - 7 x weekly - 3 sets - 10 reps - 5 seconds hold - Sidelying Shoulder External Rotation  - 1 x daily - 7 x weekly - 3 sets - 10 reps   ASSESSMENT:   CLINICAL IMPRESSION: Pt continues to respond well with exercise progressions, demonstrating good form and no increase in pain throughout the session. He demonstrates visually improved Rt shoulder elevation AROM following scapular MET. He will continue to benefit from skilled PT to address his primary impairments and return to his prior level of function with less limitation.    OBJECTIVE IMPAIRMENTS: decreased endurance, decreased knowledge of condition, decreased mobility, decreased ROM, decreased strength, hypomobility, impaired flexibility, impaired UE functional use, improper body mechanics, postural dysfunction, and pain.    ACTIVITY LIMITATIONS: carrying, lifting, transfers, bed mobility, bathing, dressing, and reach over head   PARTICIPATION LIMITATIONS: meal prep, cleaning, laundry, driving, shopping, community activity, and yard work   PERSONAL FACTORS: 3+ comorbidities: See medical hx  are also affecting patient's functional outcome.       GOALS: Goals reviewed with patient? Yes   SHORT TERM GOALS: Target date: 08/23/2022    Pt will report understanding and adherence to initial HEP in order to promote independence in the management of primary impairments. Baseline: HEP provided at eval Goal status: MET Pt reports  adherence 08/25/22       LONG TERM GOALS: Target date: 09/20/2022     Pt will achieve a FOTO score of 67% in order to demonstrate improved functional ability as it relates to the pt's primary impairments. Baseline: 51% Goal status: Progressing 09/06/22: 64%   2.  Pt will achieve Rt shoulder flexion and abduction AROM of 140 degrees in order to reach into overhead cabinets with less limitation. Baseline: See AROM chart 09/20/2021: Abduction: 134, flexion: 139 Goal status: IN PROGRESS   3.  Pt will achieve  Rt shoulder ER and IR AROM of 60 degrees in order to get dressed with less limitation. Baseline: See AROM chart 09/20/2021: See updated AROM chart Goal status: IN PROGRESS   4.  Pt will achieve global Rt shoulder MMT of 4/5 in order to return to working on his car with less limitation. Baseline: Not tested due to post-op status 09/13/2022: See updated MMT chart Goal status: ACHIEVED   5.  Pt will achieve 10 push-ups with 0-3/10 pain in order to return to his pre-surgery workout level with less limitation. Baseline: Unable due to post-op status Goal status: INITIAL     PLAN:   PT FREQUENCY: 1x/week   PT DURATION: 8 weeks   PLANNED INTERVENTIONS: Therapeutic exercises, Therapeutic activity, Neuromuscular re-education, Patient/Family education, Self Care, Joint mobilization, Dry Needling, Electrical stimulation, Spinal manipulation, Spinal mobilization, Cryotherapy, Moist heat, Taping, Vasopneumatic device, Biofeedback, Ionotophoresis 4mg /ml Dexamethasone, Manual therapy, and Re-evaluation   PLAN FOR NEXT SESSION: Progress early ROM, progress to strengthening in accordance with Dr. RTC repair protocol in lieu of pt-provided protocol   Otis Dials, PT, DPT 10/04/22 12:20 PM

## 2022-10-11 ENCOUNTER — Ambulatory Visit: Payer: No Typology Code available for payment source

## 2022-10-11 DIAGNOSIS — M6281 Muscle weakness (generalized): Secondary | ICD-10-CM

## 2022-10-11 DIAGNOSIS — M25511 Pain in right shoulder: Secondary | ICD-10-CM | POA: Diagnosis not present

## 2022-10-11 DIAGNOSIS — G8929 Other chronic pain: Secondary | ICD-10-CM

## 2022-10-11 NOTE — Therapy (Signed)
OUTPATIENT PHYSICAL THERAPY TREATMENT NOTE   Patient Name: William Meyer MRN: [id number] DOB:1946/04/11, 77 y.o., male Today's Date: 10/11/2022  PCP: [facility]  REFERRING PROVIDER: Gareth Morgan, MD   END OF SESSION:   PT End of Session - 10/11/22 1128     Visit Number 16    Number of Visits 22    Date for PT Re-Evaluation 11/22/22    Authorization Type [place]    Authorization Time Period 09/28/2022-01/26/2023 (Re-auth 'd at start of year, no visit number available)    Authorization - Visit Number 2    Authorization - Number of Visits 15    PT Start Time 1130    PT Stop Time 1220   10 minutes vasopneumatic treatment   PT Time Calculation (min) 50 min    Activity Tolerance Patient tolerated treatment well    Behavior During Therapy WFL for tasks assessed/performed                    Past Medical History:  Diagnosis Date   Anxiety    during the night- wakes up with hot flashes because of panic attacks    Arthritis    degeneration of spine    Heart murmur    minimal- - pt. told that it was detected on his exit - [place]- from [employer] service    High cholesterol    History of Doppler ultrasound    recent doppler studies of lower extremities due to pain.  Pt. told that the results were normal.    Hypertension    Neuromuscular disorder ([place])    neuropathy, tremors in R hand at times     Past Surgical History:  Procedure Laterality Date   BACK SURGERY     x2 - lumbar surgeries, cervical fusion x1   EYE SURGERY     L cataract /w IOL   HERNIA REPAIR     R side- inguinal    KNEE ARTHROSCOPY Left    LUMBAR LAMINECTOMY/DECOMPRESSION MICRODISCECTOMY N/A 07/30/2014   Procedure: LUMBAR LAMINECTOMY/DECOMPRESSION MICRODISCECTOMY. LUMBAR TWO/THREE, THREE/FOUR, FOUR/FIVE, FIVE/SACRAL ONE;  Surgeon: Newman Pies, MD;  Location: [place] [facility];  Service: Neurosurgery;  Laterality: N/A;   Patient Active Problem List   Diagnosis Date Noted   Lumbar stenosis with  neurogenic claudication 07/30/2014    REFERRING DIAG: s/p right rotator cuff repair   THERAPY DIAG:  Chronic right shoulder pain  Muscle weakness (generalized)  Rationale for Evaluation and Treatment Rehabilitation  PERTINENT HISTORY: HTN, heart murmur, anxiety, hx of neuropathy in Rt hand, hx of two lumbar surgeries and one cervical fusion, Rotator cuff repair with biceps tenodesis on 06/13/2022   PRECAUTIONS: Shoulder   SUBJECTIVE:  SUBJECTIVE STATEMENT: Pt reports continued 5-6/10 Rt shoulder pain, although he also endorses continued 7/10 LBP. He reports continued HEP adherence.   PAIN:  Are you having pain? Yes: NPRS scale: 5-6/10 Pain location: Rt shoulder Pain description: sharp, pins and needles Aggravating factors: Reaching overhead Relieving factors: rest, wearing sling   OBJECTIVE: (objective measures completed at initial evaluation unless otherwise dated)   DIAGNOSTIC FINDINGS:  None recent available in EPIC   PATIENT SURVEYS:  FOTO 51%, predicted 67% in 15 visits 09/06/22: 64%   COGNITION: Overall cognitive status: Within functional limits for tasks assessed                                  SENSATION: Not tested   POSTURE: Forward shoulders/ head   UPPER EXTREMITY ROM:    A/PROM Right eval Left eval R 11/28 R 09/06/22 Right 09/20/2022  Shoulder flexion 70p! PROM 160/165 130 P! 128/132 139 p!  Shoulder abduction 75p! PROM 180/185  90/110 135, tight  Shoulder internal rotation 40p! PROM 90/95  85 34/44  Shoulder external rotation 55p! PROM 80/85  40p! 70/75p!  (Blank rows = not tested)   UPPER EXTREMITY MMT:   MMT Right eval Left eval Right 09/13/2022 Left 09/13/2022 Right 09/20/2022  Shoulder flexion NT 5/5 4/5p!  5/5  Shoulder extension NT 5/5     Shoulder  abduction NT 5/5 5/5  5/5  Shoulder internal rotation NT 5/5 5/5  5/5  Shoulder external rotation NT 5/5 4+/5p!  5/5  Middle trapezius NT 4/5 3+/5 4/5   Lower trapezius NT 3+/5 2/5p! 3/5   Latissimus dorsi NT 5/5 5/5 5/5   Elbow flexion 5/5 5/5     Elbow extension 5/5 5/5     Grip strength (lbs) 88 86     (Blank rows = not tested)     JOINT MOBILITY TESTING:  Deferred due to post-op status   PALPATION:  No TTP about global Rt shoulder             TODAY'S TREATMENT:   [facility] Adult PT Treatment:                                                DATE: 10/11/2022 Therapeutic Exercise: Supine BIL shoulder scaption with 4# dumbbells with 3-sec hold at end-range 2x10 Supine serratus punch with 4# dumbbells 2x10 Incline push-up at roughly 3-foot high table 3x10 Standing biceps pull-downs with two 17# cables 3x10 Standing low rows with 13# 3x10 Standing pec/ shoulder ER stretch at doorway x2min Manual Therapy: N/A Neuromuscular re-ed: N/A Therapeutic Activity: N/A Modalities: 10 minutes of GameReady vasopneumatic treatment at 34d F low compression to Rt shoulder in sitting with no adverse response Self Care: N/A   [facility] Adult PT Treatment:                                                DATE: 10/04/2022 Therapeutic Exercise: UBE forward/ 2 min backward while collecting subjective information Seated arm circle angels 2x5 Standing Rt shoulder scaption AAROM with UE Ranger with small-range horizontal abduction/ adduction at end range 2x10  Standing Rt shoulder stepwise scaption lifts with 4# dumbbells  from counter to chest-level shelf, to eye-level shelf, to overhead shelf, and back again 2x8 Bent-over Rt shoulder pendulums with 4# dumbbell 3x30sec Standing combined low row and shoulder ER with two 7# cables 2x10 Standing biceps pull-downs with 23# cables 2x10 Manual Therapy: Lt sidelying Rt scapular protraction/upward rotation and retraction/ downward rotation contract/ relax MET  x5 with 30 second hold at end range  Lt sidelying STM to Rt parascapular musculature x4 minutes Standing BIL shoulder ER with 7# cables 2x10 Neuromuscular re-ed: N/A Therapeutic Activity: N/A Modalities: 10 minutes of GameReady vasopneumatic treatment at 34d F low compression to Rt shoulder in sitting with no adverse response Self Care: N/A   [facility] Adult PT Treatment:                                                DATE: 09/20/2021 Therapeutic Exercise: Standing Rt shoulder flexion AAROM with UE Ranger with short-range horizontal adduction/ abduction at end-range 2x10 Standing Rt shoulder scaption AAROM with UE Ranger 2x10 with 5-sec hold at end-range Manual Therapy: N/A Neuromuscular re-ed: N/A Therapeutic Activity: Re-assessment of objective measures with pt education Standing Rt shoulder scaption stepwise lifts with 3# dumbbell from countertop to shest-level shelf, to eye-level shelf, to overhead shelf, and back 3x8 Modalities: 10 minutes of GameReady vasopneumatic treatment at 34d F low compression to Rt shoulder in sitting with no adverse response Self Care: N/A      PATIENT EDUCATION: Education details: Pt educated on prognosis, POC, FOTO, and HEP Person educated: Patient Education method: Consulting civil engineer, Media planner, and Handouts Education comprehension: verbalized understanding and returned demonstration   HOME EXERCISE PROGRAM: Access Code: [email or web address] URL: [email or web address] Date: 08/16/2022 Prepared by: Shearon Balo  Exercises - Shoulder Scaption AAROM with Dowel  - 1 x daily - 7 x weekly - 3 sets - 10 reps - 5 seconds hold - Standing Shoulder External Rotation AAROM with Dowel (Mirrored)  - 1 x daily - 7 x weekly - 3 sets - 10 reps - 5 seconds hold - Seated Scapular Retraction  - 1 x daily - 7 x weekly - 3 sets - 10 reps - 5 seconds hold - Sidelying Shoulder External Rotation  - 1 x daily - 7 x weekly - 3 sets - 10 reps   ASSESSMENT:    CLINICAL IMPRESSION: Pt continues to progress well with all interventions, demonstrating good form and no increase in pain with new exercises. He will continue to benefit from skilled PT to address his primary impairments and return to his prior level of function with less limitation.    OBJECTIVE IMPAIRMENTS: decreased endurance, decreased knowledge of condition, decreased mobility, decreased ROM, decreased strength, hypomobility, impaired flexibility, impaired UE functional use, improper body mechanics, postural dysfunction, and pain.    ACTIVITY LIMITATIONS: carrying, lifting, transfers, bed mobility, bathing, dressing, and reach over head   PARTICIPATION LIMITATIONS: meal prep, cleaning, laundry, driving, shopping, community activity, and yard work   PERSONAL FACTORS: 3+ comorbidities: See medical hx  are also affecting patient's functional outcome.       GOALS: Goals reviewed with patient? Yes   SHORT TERM GOALS: Target date: 08/23/2022    Pt will report understanding and adherence to initial HEP in order to promote independence in the management of primary impairments. Baseline: HEP provided at eval Goal status: MET Pt reports adherence 08/25/22  LONG TERM GOALS: Target date: 09/20/2022     Pt will achieve a FOTO score of 67% in order to demonstrate improved functional ability as it relates to the pt's primary impairments. Baseline: 51% Goal status: Progressing 09/06/22: 64%   2.  Pt will achieve Rt shoulder flexion and abduction AROM of 140 degrees in order to reach into overhead cabinets with less limitation. Baseline: See AROM chart 09/20/2021: Abduction: 134, flexion: 139 Goal status: IN PROGRESS   3.  Pt will achieve Rt shoulder ER and IR AROM of 60 degrees in order to get dressed with less limitation. Baseline: See AROM chart 09/20/2021: See updated AROM chart Goal status: IN PROGRESS   4.  Pt will achieve global Rt shoulder MMT of 4/5 in order to return to  working on his car with less limitation. Baseline: Not tested due to post-op status 09/13/2022: See updated MMT chart Goal status: ACHIEVED   5.  Pt will achieve 10 push-ups with 0-3/10 pain in order to return to his pre-surgery workout level with less limitation. Baseline: Unable due to post-op status Goal status: INITIAL     PLAN:   PT FREQUENCY: 1x/week   PT DURATION: 8 weeks   PLANNED INTERVENTIONS: Therapeutic exercises, Therapeutic activity, Neuromuscular re-education, Patient/Family education, Self Care, Joint mobilization, Dry Needling, Electrical stimulation, Spinal manipulation, Spinal mobilization, Cryotherapy, Moist heat, Taping, Vasopneumatic device, Biofeedback, Ionotophoresis 4mg /ml Dexamethasone, Manual therapy, and Re-evaluation   PLAN FOR NEXT SESSION: Progress early ROM, progress to strengthening in accordance with Dr. RTC repair protocol in lieu of pt-provided protocol   Otis Dials, PT, DPT 10/11/22 12:22 PM

## 2022-10-18 ENCOUNTER — Ambulatory Visit: Payer: No Typology Code available for payment source

## 2022-10-18 DIAGNOSIS — M25511 Pain in right shoulder: Secondary | ICD-10-CM | POA: Diagnosis not present

## 2022-10-18 DIAGNOSIS — M6281 Muscle weakness (generalized): Secondary | ICD-10-CM

## 2022-10-18 DIAGNOSIS — G8929 Other chronic pain: Secondary | ICD-10-CM

## 2022-10-18 NOTE — Therapy (Signed)
OUTPATIENT PHYSICAL THERAPY TREATMENT NOTE/ DISCHARGE SUMMARY   Patient Name: William Meyer MRN: [id number] DOB:1946/05/04, 77 y.o., male Today's Date: 10/18/2022  PCP: [facility]  REFERRING PROVIDER: Gareth Morgan, MD   END OF SESSION:   PT End of Session - 10/18/22 1131     Visit Number 17    Number of Visits 22    Date for PT Re-Evaluation 11/22/22    Authorization Type [place]    Authorization Time Period 09/28/2022-01/26/2023 (Re-auth 'd at start of year, no visit number available)    Authorization - Visit Number 3    Authorization - Number of Visits 15    PT Start Time 1132    PT Stop Time 1212    PT Time Calculation (min) 40 min    Activity Tolerance Patient tolerated treatment well    Behavior During Therapy WFL for tasks assessed/performed                     Past Medical History:  Diagnosis Date   Anxiety    during the night- wakes up with hot flashes because of panic attacks    Arthritis    degeneration of spine    Heart murmur    minimal- - pt. told that it was detected on his exit - [facility]- from [employer] service    High cholesterol    History of Doppler ultrasound    recent doppler studies of lower extremities due to pain.  Pt. told that the results were normal.    Hypertension    Neuromuscular disorder ([place])    neuropathy, tremors in R hand at times     Past Surgical History:  Procedure Laterality Date   BACK SURGERY     x2 - lumbar surgeries, cervical fusion x1   EYE SURGERY     L cataract /w IOL   HERNIA REPAIR     R side- inguinal    KNEE ARTHROSCOPY Left    LUMBAR LAMINECTOMY/DECOMPRESSION MICRODISCECTOMY N/A 07/30/2014   Procedure: LUMBAR LAMINECTOMY/DECOMPRESSION MICRODISCECTOMY. LUMBAR TWO/THREE, THREE/FOUR, FOUR/FIVE, FIVE/SACRAL ONE;  Surgeon: Newman Pies, MD;  Location: [place] [facility];  Service: Neurosurgery;  Laterality: N/A;   Patient Active Problem List   Diagnosis Date Noted   Lumbar stenosis with neurogenic  claudication 07/30/2014    REFERRING DIAG: s/p right rotator cuff repair   THERAPY DIAG:  Chronic right shoulder pain  Muscle weakness (generalized)  Rationale for Evaluation and Treatment Rehabilitation  PERTINENT HISTORY: HTN, heart murmur, anxiety, hx of neuropathy in Rt hand, hx of two lumbar surgeries and one cervical fusion, Rotator cuff repair with biceps tenodesis on 06/13/2022   PRECAUTIONS: Shoulder   SUBJECTIVE:  SUBJECTIVE STATEMENT: Pt reports he overdid it while putting a fence up two days ago and now has LBP. He reports has made good progress in his Rt shoulder, although he feels that he has hit a plateau in his progress. He is ready for discharge today and reports if he needs more care, he will get back in for more PT under his current [place] authorization period that ends on May 9th.   PAIN:  Are you having pain? Yes: NPRS scale: 5-6/10 Pain location: Rt shoulder Pain description: sharp, pins and needles Aggravating factors: Reaching overhead Relieving factors: rest, wearing sling   OBJECTIVE: (objective measures completed at initial evaluation unless otherwise dated)   DIAGNOSTIC FINDINGS:  None recent available in EPIC   PATIENT SURVEYS:  FOTO 51%, predicted 67% in 15 visits 09/06/22: 64%   COGNITION: Overall cognitive status: Within functional limits for tasks assessed                                  SENSATION: Not tested   POSTURE: Forward shoulders/ head   UPPER EXTREMITY ROM:    A/PROM Right eval Left eval R 11/28 R 09/06/22 Right 09/20/2022 Right 10/18/2022  Shoulder flexion 70p! PROM 160/165 130 P! 128/132 139 p! 151  Shoulder abduction 75p! PROM 180/185  90/110 135, tight 145  Shoulder internal rotation 40p! PROM 90/95  85 34/44 50  Shoulder external rotation  55p! PROM 80/85  40p! 70/75p! 75  (Blank rows = not tested)   UPPER EXTREMITY MMT:   MMT Right eval Left eval Right 09/13/2022 Left 09/13/2022 Right 09/20/2022  Shoulder flexion NT 5/5 4/5p!  5/5  Shoulder extension NT 5/5     Shoulder abduction NT 5/5 5/5  5/5  Shoulder internal rotation NT 5/5 5/5  5/5  Shoulder external rotation NT 5/5 4+/5p!  5/5  Middle trapezius NT 4/5 3+/5 4/5   Lower trapezius NT 3+/5 2/5p! 3/5   Latissimus dorsi NT 5/5 5/5 5/5   Elbow flexion 5/5 5/5     Elbow extension 5/5 5/5     Grip strength (lbs) 88 86     (Blank rows = not tested)     JOINT MOBILITY TESTING:  Deferred due to post-op status   PALPATION:  No TTP about global Rt shoulder             TODAY'S TREATMENT:   [facility] Adult PT Treatment:                                                DATE: 10/18/2022 Therapeutic Exercise: Incline push-up at edge of table 2x10 Seated BIL shoulder flexion AROM up bolster at table 2x10 with 5-sec hold at end range Seated BIL shoulder ER with RTB 2x10 Standing low row with GTB 2x10 Manual Therapy: N/A Neuromuscular re-ed: N/A Therapeutic Activity: Re-assessment of objective measures/ patient goals with pt education Re-administration of FOTO with pt education Modalities: N/A Self Care: N/A   [facility] Adult PT Treatment:                                                DATE: 10/11/2022 Therapeutic Exercise: Supine BIL shoulder  scaption with 4# dumbbells with 3-sec hold at end-range 2x10 Supine serratus punch with 4# dumbbells 2x10 Incline push-up at roughly 3-foot high table 3x10 Standing biceps pull-downs with two 17# cables 3x10 Standing low rows with 13# 3x10 Standing pec/ shoulder ER stretch at doorway x87min Manual Therapy: N/A Neuromuscular re-ed: N/A Therapeutic Activity: N/A Modalities: 10 minutes of GameReady vasopneumatic treatment at 34d F low compression to Rt shoulder in sitting with no adverse response Self Care: N/A   [facility] Adult  PT Treatment:                                                DATE: 10/04/2022 Therapeutic Exercise: UBE 83min forward/ 2 min backward while collecting subjective information Seated arm circle angels 2x5 Standing Rt shoulder scaption AAROM with UE Ranger with small-range horizontal abduction/ adduction at end range 2x10  Standing Rt shoulder stepwise scaption lifts with 4# dumbbells from counter to chest-level shelf, to eye-level shelf, to overhead shelf, and back again 2x8 Bent-over Rt shoulder pendulums with 4# dumbbell 3x30sec Standing combined low row and shoulder ER with two 7# cables 2x10 Standing biceps pull-downs with 23# cables 2x10 Manual Therapy: Lt sidelying Rt scapular protraction/upward rotation and retraction/ downward rotation contract/ relax MET x5 with 30 second hold at end range  Lt sidelying STM to Rt parascapular musculature x4 minutes Standing BIL shoulder ER with 7# cables 2x10 Neuromuscular re-ed: N/A Therapeutic Activity: N/A Modalities: 10 minutes of GameReady vasopneumatic treatment at 34d F low compression to Rt shoulder in sitting with no adverse response Self Care: N/A     PATIENT EDUCATION: Education details: Pt educated on prognosis, POC, FOTO, and HEP Person educated: Patient Education method: Consulting civil engineer, Media planner, and Handouts Education comprehension: verbalized understanding and returned demonstration   HOME EXERCISE PROGRAM: Access Code: [email or web address] URL: [email or web address] Date: 10/18/2022 Prepared by: Vanessa   Exercises - Shoulder Scaption AAROM with Dowel  - 1 x daily - 7 x weekly - 3 sets - 10 reps - 5 seconds hold - Standing Shoulder External Rotation AAROM with Dowel (Mirrored)  - 1 x daily - 7 x weekly - 3 sets - 10 reps - 5 seconds hold - Sidelying Shoulder External Rotation  - 1 x daily - 7 x weekly - 3 sets - 10 reps - Shoulder External Rotation and Scapular Retraction with Resistance  - 1 x daily - 7 x  weekly - 3 sets - 10 reps - 3 seconds hold - Seated Flexion Stretch with Swiss Ball  - 1 x daily - 7 x weekly - 3 sets - 10 reps - 5 seconds hold - Standing Shoulder Row with Anchored Resistance  - 1 x daily - 7 x weekly - 3 sets - 10 reps   ASSESSMENT:   CLINICAL IMPRESSION: Upon re-assessment, the pt has met or mostly met all of his functional rehab goals, making excellent progress in global Rt shoulder strength, AROM, push-up capacity, and functional score. He requests to be discharged at this time and plans to continue therapy if needed before the end of his insurance authorization on May 9th. He is discharged at this time with an updated HEP.   OBJECTIVE IMPAIRMENTS: decreased endurance, decreased knowledge of condition, decreased mobility, decreased ROM, decreased strength, hypomobility, impaired flexibility, impaired UE functional use, improper body mechanics, postural dysfunction, and pain.    ACTIVITY LIMITATIONS: carrying,  lifting, transfers, bed mobility, bathing, dressing, and reach over head   PARTICIPATION LIMITATIONS: meal prep, cleaning, laundry, driving, shopping, community activity, and yard work   PERSONAL FACTORS: 3+ comorbidities: See medical hx  are also affecting patient's functional outcome.       GOALS: Goals reviewed with patient? Yes   SHORT TERM GOALS: Target date: 08/23/2022    Pt will report understanding and adherence to initial HEP in order to promote independence in the management of primary impairments. Baseline: HEP provided at eval Goal status: MET Pt reports adherence 08/25/22       LONG TERM GOALS: Target date: 09/20/2022     Pt will achieve a FOTO score of 67% in order to demonstrate improved functional ability as it relates to the pt's primary impairments. Baseline: 51% Goal status: ACHIEVED 09/06/22: 64% 10/18/2022: 67%   2.  Pt will achieve Rt shoulder flexion and abduction AROM of 140 degrees in order to reach into overhead cabinets with  less limitation. Baseline: See AROM chart 09/20/2021: Abduction: 134, flexion: 139 10/18/2022: Abduction: 145, flexion: 151 Goal status: ACHIEVED   3.  Pt will achieve Rt shoulder ER and IR AROM of 60 degrees in order to get dressed with less limitation. Baseline: See AROM chart 09/20/2021: See updated AROM chart 10/18/2022: See updated AROM chart Goal status: *NEARLY MET   4.  Pt will achieve global Rt shoulder MMT of 4/5 in order to return to working on his car with less limitation. Baseline: Not tested due to post-op status 09/13/2022: See updated MMT chart 09/20/2022: 5/5 globally Goal status: ACHIEVED   5.  Pt will achieve 10 push-ups with 0-3/10 pain in order to return to his pre-surgery workout level with less limitation. Baseline: Unable due to post-op status 10/18/2022: 10 low incline push-ups at side of wood mat table with no increase in pain Goal status: NEARLY MET     PLAN:   PT FREQUENCY: 1x/week   PT DURATION: 8 weeks   PLANNED INTERVENTIONS: Therapeutic exercises, Therapeutic activity, Neuromuscular re-education, Patient/Family education, Self Care, Joint mobilization, Dry Needling, Electrical stimulation, Spinal manipulation, Spinal mobilization, Cryotherapy, Moist heat, Taping, Vasopneumatic device, Biofeedback, Ionotophoresis 4mg /ml Dexamethasone, Manual therapy, and Re-evaluation   PLAN FOR NEXT SESSION: Pt is discharged from PT at this time.  PHYSICAL THERAPY DISCHARGE SUMMARY  Visits from Start of Care: 17  Current functional level related to goals / functional outcomes: Pt has met or mostly met all of his rehab goals.   Remaining deficits: Diminished ability to complete traditional push-up and mild limitation in Rt shoulder AROM   Education / Equipment: Updated HEP   Patient agrees to discharge. Patient goals were met. Patient is being discharged due to being pleased with the current functional level.   Vanessa Walcott, PT, DPT 10/18/22 12:12  PM
